# Patient Record
Sex: Female | Born: 1970 | Race: Black or African American | Hispanic: No | Marital: Single | Smoking: Never smoker
Health system: Southern US, Community
[De-identification: ages and names within clinical notes are randomized; demographics above are authoritative.]

## PROBLEM LIST (undated history)

## (undated) HISTORY — PX: HAND SURGERY: SHX662

---

## 1999-02-17 ENCOUNTER — Emergency Department (HOSPITAL_COMMUNITY): Admission: EM | Admit: 1999-02-17 | Discharge: 1999-02-17 | Payer: Self-pay | Admitting: Emergency Medicine

## 1999-11-02 ENCOUNTER — Encounter: Payer: Self-pay | Admitting: Otolaryngology

## 1999-11-03 ENCOUNTER — Encounter (INDEPENDENT_AMBULATORY_CARE_PROVIDER_SITE_OTHER): Payer: Self-pay | Admitting: *Deleted

## 1999-11-03 ENCOUNTER — Inpatient Hospital Stay (HOSPITAL_COMMUNITY): Admission: RE | Admit: 1999-11-03 | Discharge: 1999-11-04 | Payer: Self-pay | Admitting: Otolaryngology

## 1999-11-20 ENCOUNTER — Ambulatory Visit: Admission: RE | Admit: 1999-11-20 | Discharge: 1999-11-20 | Payer: Self-pay | Admitting: Otolaryngology

## 2000-03-09 ENCOUNTER — Encounter: Payer: Self-pay | Admitting: Emergency Medicine

## 2000-03-09 ENCOUNTER — Emergency Department (HOSPITAL_COMMUNITY): Admission: EM | Admit: 2000-03-09 | Discharge: 2000-03-09 | Payer: Self-pay | Admitting: Emergency Medicine

## 2000-03-15 ENCOUNTER — Emergency Department (HOSPITAL_COMMUNITY): Admission: EM | Admit: 2000-03-15 | Discharge: 2000-03-15 | Payer: Self-pay | Admitting: Emergency Medicine

## 2000-05-09 ENCOUNTER — Emergency Department (HOSPITAL_COMMUNITY): Admission: EM | Admit: 2000-05-09 | Discharge: 2000-05-10 | Payer: Self-pay | Admitting: Emergency Medicine

## 2000-06-20 ENCOUNTER — Encounter: Payer: Self-pay | Admitting: Internal Medicine

## 2000-06-20 ENCOUNTER — Ambulatory Visit (HOSPITAL_COMMUNITY): Admission: RE | Admit: 2000-06-20 | Discharge: 2000-06-20 | Payer: Self-pay | Admitting: Internal Medicine

## 2008-07-06 ENCOUNTER — Emergency Department (HOSPITAL_COMMUNITY): Admission: EM | Admit: 2008-07-06 | Discharge: 2008-07-06 | Payer: Self-pay | Admitting: Emergency Medicine

## 2009-03-02 ENCOUNTER — Emergency Department (HOSPITAL_COMMUNITY): Admission: EM | Admit: 2009-03-02 | Discharge: 2009-03-02 | Payer: Self-pay | Admitting: Emergency Medicine

## 2011-01-03 LAB — URINE MICROSCOPIC-ADD ON

## 2011-01-03 LAB — URINALYSIS, ROUTINE W REFLEX MICROSCOPIC
Bilirubin Urine: NEGATIVE
Glucose, UA: NEGATIVE mg/dL
Ketones, ur: NEGATIVE mg/dL
Leukocytes, UA: NEGATIVE
Specific Gravity, Urine: 1.018 (ref 1.005–1.030)
pH: 6 (ref 5.0–8.0)

## 2011-02-11 NOTE — Op Note (Signed)
[place]. [facility]  Patient:    Angela Reynolds, Angela Reynolds                           MRN: [id number] Proc. Date: 11/03/99 Adm. Date:  [id number] Attending:  Barbee Cough                           Operative Report  PREOPERATIVE DIAGNOSIS:  Tonsillar hypertrophy.  Obstructive sleep apnea.  POSTOPERATIVE DIAGNOSIS:  Tonsillar hypertrophy.  Obstructive sleep apnea.  PROCEDURE:  Tonsillectomy.  ANESTHESIA:  General endotracheal anesthesia.  SURGEON:  Kinnie Scales. Annalee Genta, M.D.  COMPLICATIONS:  None.  ESTIMATED BLOOD LOSS:  Minimal.  CONDITION:  The patient was transferred from the operating room to the recovery  room in stable condition.  INDICATIONS:  [name] is a 40 year old black female who was referred from her primary care physician at [facility] for evaluation of significant tonsillar hypertrophy.  The patient has a history of nighttime airway obstruction and recurrent tonsillitis.  Examination revealed 4+ cryptic tonsils with significant erythema.  Given the patients history and examination, I recommended that we undertake tonsillectomy under general anesthesia.  The risks, benefits, and possible complications of this surgical procedure were discussed in detail with [name] and her family.  They understand and concur with our plan for surgery.  DESCRIPTION OF PROCEDURE:  The patient is brought to the operating room on November 03, 1999, and placed in the supine position on the operating table.  General endotracheal anesthesia was established without difficulty and the patient was adequately anesthetized.  A Crowe-Davis mouth gag was inserted and the oral cavity and oropharynx were examined.  The patient was found to have significantly hypertrophied tonsils with chronic cryptic changes.  The surgical procedure was  begun by dissecting the left tonsil in a subcapsular fashion using the Harmonic  scalpel.  The entire left tonsil was resected from  superior pole of the tongue base.  The right tonsil was removed in a similar fashion and all resected tissue was sent to pathology for gross and microscopic evaluation.  Hemostasis was maintained using Bovie suction cautery at the tongue base.  The tonsilar fossae  were gently abraded with a dry tonsilar sponge.  There was no active bleeding. The Crowe-Davis mouth gag was released and reapplied and again there was no evidence of active bleeding.  An oral gastric tube was inserted without difficulty and the stomach contents were aspirated.  The patients oral cavity and oropharynx were then thoroughly irrigated and suctioned.  She was awakened from her anesthetic.  The Crowe-Davis mouth gag was released and removed and again there were no loose or broken teeth and no active bleeding.  She was awakened and extubated without difficulty.  She was then transferred from the operating room to the recovery room in stable condition. DD:  11/03/99 TD:  11/03/99 Job: [id number]

## 2011-02-11 NOTE — Discharge Summary (Signed)
[place]. [facility]  Patient:    Angela Reynolds, Angela Reynolds                           MRN: [id number] Adm. Date:  [id number] Disc. Date: [id number] Attending:  Barbee Cough                           Discharge Summary  SURGICAL PROCEDURE:  Tonsillectomy on November 03, 1999.  ADMISSION DIAGNOSES: 1. Tonsillar hypertrophy. 2. Obstructive sleep apnea. 3. Chronic nasal obstruction.  DISCHARGE DIAGNOSES: 1. Tonsillar hypertrophy. 2. Obstructive sleep apnea. 3. Chronic nasal obstruction.  DISPOSITION AND CONDITION ON DISCHARGE:  The patient is discharged to home in stable condition in the company of her family.  DISCHARGE MEDICATIONS:  Include her preoperative medications, which are: 1. Albuterol metered dose inhaler 2 puffs p.o. p.r.n. 2. Flovent metered dose inhaler 2 puffs b.i.d. 3. Flonase nasal inhaler 2 puffs b.i.d. 4. Allegra 60 mg p.o. q.d. 5. Prozac 10 mg p.o. q.d. 6. Additional discharge medications include:    a. Lortab elixir 2-3 teaspoon every four to six hours p.r.n. pain.    b. Augmentin elixir 400 mg/5 cc 2 teaspoons twice daily for 10 days.  ACTIVITY:  Limited.  No lifting or straining.  DIET:  Liquids and soft diet only.  WOUND CARE:  No specific wound care.  FOLLOW-UP:  The patient will follow up in my office on November 24, 1999, for further postoperative evaluation, or sooner as warranted by worsening symptoms.   HISTORY OF PRESENT ILLNESS:  Angela Reynolds is a 40 year old black female who was referred by her primary care physician at [facility] for evaluation of significant tonsillar hypertrophy and possible airway obstruction.  The patients examination revealed 4+ cryptic tonsils with significant partial obstruction.  In reviewing her symptoms, she has nightly airway obstruction and chronic nasal obstruction. Given the patients history and examination, I recommended that we undertake tonsillectomy.  The risks, benefits, possible  complications of this procedure were discussed in detail with the patient and her family.  They understood and concurred with our plan.  The patient has a significant history of mild reactive airway disease and obstructive sleep apnea and, given these findings, I recommended that we perform the surgery at the main hospital under general anesthesia with postoperative monitoring for possible exacerbation of apnea.  HOSPITAL COURSE:  The patient was admitted to the ENT service under Dr. Clovis Pu care on November 03, 1999.  She was taken to the operating room at [facility]  Main OR, and underwent tonsillectomy under general anesthesia.  She was transferred from the operating room to recovery, and from recovery to [address] for postoperative care.  The patients postoperative course was uneventful.  She was  tolerating a soft oral diet and liquids without difficulty.  She had moderate discomfort which was well controlled with her oral pain medication.  Her cardiac rhythm and pulse oximetry were monitored throughout the night.  On room air, her oxygen saturation was not below 95%.  The patient was discharged on the first postoperative morning.  She was tolerating a normal diet, had normal bowel and bladder function, ambulating without difficulty.  She will follow up in my office in two to three weeks for reevaluation, with the above discharge instructions. She is comfortable with discharge, and will follow up as above.DD:  11/04/99 TD:  11/04/99 Job: [id number]

## 2011-02-18 ENCOUNTER — Emergency Department (HOSPITAL_COMMUNITY)
Admission: EM | Admit: 2011-02-18 | Discharge: 2011-02-18 | Disposition: A | Discharge status: Home / Self Care | Payer: Medicaid Other | Attending: Emergency Medicine | Admitting: Emergency Medicine

## 2011-02-18 DIAGNOSIS — R059 Cough, unspecified: Secondary | ICD-10-CM | POA: Insufficient documentation

## 2011-02-18 DIAGNOSIS — J3489 Other specified disorders of nose and nasal sinuses: Secondary | ICD-10-CM | POA: Insufficient documentation

## 2011-02-18 DIAGNOSIS — R05 Cough: Secondary | ICD-10-CM | POA: Insufficient documentation

## 2011-02-18 DIAGNOSIS — N390 Urinary tract infection, site not specified: Secondary | ICD-10-CM | POA: Insufficient documentation

## 2011-02-18 DIAGNOSIS — N760 Acute vaginitis: Secondary | ICD-10-CM | POA: Insufficient documentation

## 2011-02-18 DIAGNOSIS — J45909 Unspecified asthma, uncomplicated: Secondary | ICD-10-CM | POA: Insufficient documentation

## 2011-02-18 DIAGNOSIS — J029 Acute pharyngitis, unspecified: Secondary | ICD-10-CM | POA: Insufficient documentation

## 2011-02-18 DIAGNOSIS — R109 Unspecified abdominal pain: Secondary | ICD-10-CM | POA: Insufficient documentation

## 2011-02-18 LAB — URINE MICROSCOPIC-ADD ON

## 2011-02-18 LAB — URINALYSIS, ROUTINE W REFLEX MICROSCOPIC
Ketones, ur: NEGATIVE mg/dL
Protein, ur: NEGATIVE mg/dL
Urobilinogen, UA: 1 mg/dL (ref 0.0–1.0)

## 2011-02-18 LAB — WET PREP, GENITAL: Yeast Wet Prep HPF POC: NONE SEEN

## 2011-02-19 LAB — GC/CHLAMYDIA PROBE AMP, GENITAL: GC Probe Amp, Genital: NEGATIVE

## 2011-09-08 ENCOUNTER — Emergency Department (HOSPITAL_COMMUNITY)
Admission: EM | Admit: 2011-09-08 | Discharge: 2011-09-08 | Disposition: A | Discharge status: Home / Self Care | Payer: Medicaid Other | Attending: Emergency Medicine | Admitting: Emergency Medicine

## 2011-09-08 ENCOUNTER — Encounter: Payer: Self-pay | Admitting: *Deleted

## 2011-09-08 DIAGNOSIS — S335XXA Sprain of ligaments of lumbar spine, initial encounter: Secondary | ICD-10-CM | POA: Insufficient documentation

## 2011-09-08 DIAGNOSIS — S161XXA Strain of muscle, fascia and tendon at neck level, initial encounter: Secondary | ICD-10-CM

## 2011-09-08 DIAGNOSIS — S139XXA Sprain of joints and ligaments of unspecified parts of neck, initial encounter: Secondary | ICD-10-CM | POA: Insufficient documentation

## 2011-09-08 DIAGNOSIS — S39012A Strain of muscle, fascia and tendon of lower back, initial encounter: Secondary | ICD-10-CM

## 2011-09-08 DIAGNOSIS — Y92410 Unspecified street and highway as the place of occurrence of the external cause: Secondary | ICD-10-CM | POA: Insufficient documentation

## 2011-09-08 MED ORDER — DIAZEPAM 5 MG PO TABS: 5.0000 mg | ORAL_TABLET | Freq: Two times a day (BID) | ORAL | Status: AC

## 2011-09-08 MED ORDER — HYDROCODONE-ACETAMINOPHEN 5-500 MG PO TABS: 1.0000 | ORAL_TABLET | Freq: Four times a day (QID) | ORAL | Status: AC | PRN

## 2011-09-08 MED ORDER — DIAZEPAM 5 MG PO TABS
5.0000 mg | ORAL_TABLET | Freq: Once | ORAL | Status: AC
  Administered 2011-09-08: 5 mg via ORAL
  Filled 2011-09-08: qty 1

## 2011-09-08 MED ORDER — HYDROCODONE-ACETAMINOPHEN 5-325 MG PO TABS
1.0000 | ORAL_TABLET | Freq: Once | ORAL | Status: AC
  Administered 2011-09-08: 1 via ORAL
  Filled 2011-09-08: qty 1
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
## 2011-09-12 NOTE — ED Provider Notes (Signed)
Medical screening examination/treatment/procedure(s) were performed by non-physician practitioner and as supervising physician I was immediately available for consultation/collaboration.   Luie Laneve, MD 09/12/11 0809 

## 2013-08-20 ENCOUNTER — Encounter (HOSPITAL_COMMUNITY): Payer: Self-pay | Admitting: Emergency Medicine

## 2013-08-20 DIAGNOSIS — J45901 Unspecified asthma with (acute) exacerbation: Secondary | ICD-10-CM | POA: Insufficient documentation

## 2013-08-20 DIAGNOSIS — R0982 Postnasal drip: Secondary | ICD-10-CM | POA: Insufficient documentation

## 2013-08-20 DIAGNOSIS — Z79899 Other long term (current) drug therapy: Secondary | ICD-10-CM | POA: Insufficient documentation

## 2013-08-20 NOTE — ED Notes (Signed)
Pt states that for the past 3 weeks she has been having a productive cough. Pt states that she has been using her nebulizers and that helps with the productivity of the cough.

## 2013-08-21 ENCOUNTER — Emergency Department (HOSPITAL_COMMUNITY): Payer: Medicaid Other

## 2013-08-21 ENCOUNTER — Emergency Department (HOSPITAL_COMMUNITY)
Admission: EM | Admit: 2013-08-21 | Discharge: 2013-08-21 | Disposition: A | Discharge status: Home / Self Care | Payer: Medicaid Other | Attending: Emergency Medicine | Admitting: Emergency Medicine

## 2013-08-21 DIAGNOSIS — R0982 Postnasal drip: Secondary | ICD-10-CM

## 2013-08-21 MED ORDER — LORATADINE 10 MG PO TABS
10.0000 mg | ORAL_TABLET | Freq: Once | ORAL | Status: AC
  Administered 2013-08-21: 10 mg via ORAL
  Filled 2013-08-21: qty 1

## 2013-08-21 MED ORDER — IBUPROFEN 800 MG PO TABS
800.0000 mg | ORAL_TABLET | Freq: Once | ORAL | Status: AC
  Administered 2013-08-21: 800 mg via ORAL
  Filled 2013-08-21: qty 1

## 2013-08-21 MED ORDER — FLUTICASONE PROPIONATE 50 MCG/ACT NA SUSP: 1.0000 | Freq: Every day | NASAL | Status: DC

## 2013-08-21 MED ORDER — LORATADINE 10 MG PO TABS: 10.0000 mg | ORAL_TABLET | Freq: Every day | ORAL | Status: DC

## 2013-08-21 MED ORDER — GUAIFENESIN ER 600 MG PO TB12: 600.0000 mg | ORAL_TABLET | Freq: Two times a day (BID) | ORAL | Status: DC

## 2013-08-21 NOTE — ED Provider Notes (Signed)
CSN: [id number]     Arrival date & time 08/20/13  2328 History   First MD Initiated Contact with Patient 08/21/13 0109     Chief Complaint  Patient presents with  . Cough   (Consider location/radiation/quality/duration/timing/severity/associated sxs/prior Treatment) Patient is a 42 y.o. female presenting with cough. The history is provided by the patient.  Cough Cough characteristics:  Productive Sputum characteristics:  Clear Severity:  Mild Onset quality:  Gradual Timing:  Intermittent Progression:  Unchanged Chronicity:  New Smoker: no   Context: upper respiratory infection and weather changes   Relieved by:  Nothing Worsened by:  Nothing tried Ineffective treatments:  Beta-agonist inhaler Associated symptoms: wheezing   Associated symptoms: no chest pain, no fever and no shortness of breath   No travel no leg swelling.    Past Medical History  Diagnosis Date  . Asthma    Past Surgical History  Procedure Laterality Date  . Hand surgery     History reviewed. No pertinent family history. History  Substance Use Topics  . Smoking status: Never Smoker   . Smokeless tobacco: Not on file  . Alcohol Use: Yes     Comment: occassion   OB History   Grav Para Term Preterm Abortions TAB SAB Ect Mult Living                 Review of Systems  Constitutional: Negative for fever.  HENT: Positive for congestion.   Respiratory: Positive for cough and wheezing. Negative for chest tightness and shortness of breath.   Cardiovascular: Negative for chest pain, palpitations and leg swelling.  All other systems reviewed and are negative.    Allergies  Amoxicillin  Home Medications   Current Outpatient Rx  Name  Route  Sig  Dispense  Refill  . albuterol (PROVENTIL HFA;VENTOLIN HFA) 108 (90 BASE) MCG/ACT inhaler   Inhalation   Inhale 2 puffs into the lungs every 6 (six) hours as needed.           Marland Kitchen albuterol (PROVENTIL) (2.5 MG/3ML) 0.083% nebulizer solution  Nebulization   Take 2.5 mg by nebulization every 4 (four) hours as needed for wheezing or shortness of breath.         . budesonide-formoterol (SYMBICORT) 160-4.5 MCG/ACT inhaler   Inhalation   Inhale 2 puffs into the lungs 2 (two) times daily.            BP 105/54  Pulse 89  Temp(Src) 98.9 F (37.2 C) (Oral)  Resp 18  Wt 332 lb 6.4 oz (150.776 kg)  SpO2 96%  LMP 08/03/2013 Physical Exam  Constitutional: She appears well-developed and well-nourished. No distress.  HENT:  Head: Atraumatic.  Mouth/Throat: No oropharyngeal exudate.  Cobblestoning and drainage consistent with post nasal drip  Eyes: Conjunctivae are normal. Pupils are equal, round, and reactive to light.  Neck: Normal range of motion. Neck supple.  Cardiovascular: Normal rate, regular rhythm and intact distal pulses.   Pulmonary/Chest: Effort normal and breath sounds normal. No stridor. No respiratory distress. She has no wheezes. She has no rales.  Abdominal: Soft. Bowel sounds are normal. There is no tenderness. There is no rebound and no guarding.  Musculoskeletal: Normal range of motion. She exhibits no edema and no tenderness.  Skin: Skin is warm and dry.  Psychiatric: She has a normal mood and affect.    ED Course  Procedures (including critical care time) Labs Review Labs Reviewed - No data to display Imaging Review Dg Chest Portable 1 View  08/21/2013   CLINICAL DATA:  Cough.  Asthma.  EXAM: PORTABLE CHEST - 1 VIEW  COMPARISON:  03/02/2009  FINDINGS: The heart size and mediastinal contours are within normal limits. Both lungs are clear. The visualized skeletal structures are unremarkable.  IMPRESSION: No active disease.   Electronically Signed   By: Myles Rosenthal M.D.   On: 08/21/2013 00:50    EKG Interpretation   None       MDM  No diagnosis found. Exam and symptoms consistent with post nasal drip will treat    Andron Marrazzo K Berlie Persky-Rasch, MD 08/21/13 [phone]

## 2013-08-21 NOTE — ED Notes (Signed)
Pt. Reports cough started x3 weeks ago with cold. Pt. States intermittently productive with white sputum. States has been using nebulizer treatment 4-5 times a day with no relief.

## 2014-06-30 ENCOUNTER — Emergency Department (HOSPITAL_COMMUNITY)
Admission: EM | Admit: 2014-06-30 | Discharge: 2014-06-30 | Disposition: A | Discharge status: Home / Self Care | Payer: Medicaid Other | Attending: Emergency Medicine | Admitting: Emergency Medicine

## 2014-06-30 ENCOUNTER — Emergency Department (HOSPITAL_COMMUNITY): Payer: Medicaid Other

## 2014-06-30 ENCOUNTER — Encounter (HOSPITAL_COMMUNITY): Payer: Self-pay | Admitting: Emergency Medicine

## 2014-06-30 DIAGNOSIS — Z880 Allergy status to penicillin: Secondary | ICD-10-CM | POA: Diagnosis not present

## 2014-06-30 DIAGNOSIS — J45909 Unspecified asthma, uncomplicated: Secondary | ICD-10-CM

## 2014-06-30 DIAGNOSIS — Y939 Activity, unspecified: Secondary | ICD-10-CM | POA: Insufficient documentation

## 2014-06-30 DIAGNOSIS — S60222D Contusion of left hand, subsequent encounter: Secondary | ICD-10-CM | POA: Diagnosis not present

## 2014-06-30 DIAGNOSIS — E669 Obesity, unspecified: Secondary | ICD-10-CM | POA: Insufficient documentation

## 2014-06-30 DIAGNOSIS — Y929 Unspecified place or not applicable: Secondary | ICD-10-CM | POA: Diagnosis not present

## 2014-06-30 DIAGNOSIS — Z79899 Other long term (current) drug therapy: Secondary | ICD-10-CM | POA: Diagnosis not present

## 2014-06-30 DIAGNOSIS — J45901 Unspecified asthma with (acute) exacerbation: Secondary | ICD-10-CM | POA: Diagnosis not present

## 2014-06-30 DIAGNOSIS — S59901D Unspecified injury of right elbow, subsequent encounter: Secondary | ICD-10-CM | POA: Insufficient documentation

## 2014-06-30 DIAGNOSIS — Z7951 Long term (current) use of inhaled steroids: Secondary | ICD-10-CM | POA: Insufficient documentation

## 2014-06-30 DIAGNOSIS — Z9889 Other specified postprocedural states: Secondary | ICD-10-CM | POA: Insufficient documentation

## 2014-06-30 DIAGNOSIS — S60222A Contusion of left hand, initial encounter: Secondary | ICD-10-CM

## 2014-06-30 DIAGNOSIS — W1830XD Fall on same level, unspecified, subsequent encounter: Secondary | ICD-10-CM | POA: Insufficient documentation

## 2014-06-30 LAB — I-STAT TROPONIN, ED: Troponin i, poc: 0 ng/mL (ref 0.00–0.08)

## 2014-06-30 LAB — CBC WITH DIFFERENTIAL/PLATELET
BASOS PCT: 0 % (ref 0–1)
Basophils Absolute: 0 10*3/uL (ref 0.0–0.1)
EOS PCT: 3 % (ref 0–5)
Eosinophils Absolute: 0.3 10*3/uL (ref 0.0–0.7)
HCT: 36.6 % (ref 36.0–46.0)
HEMOGLOBIN: 12.4 g/dL (ref 12.0–15.0)
LYMPHS PCT: 40 % (ref 12–46)
Lymphs Abs: 3.9 10*3/uL (ref 0.7–4.0)
MCH: 30.7 pg (ref 26.0–34.0)
MCHC: 33.9 g/dL (ref 30.0–36.0)
MCV: 90.6 fL (ref 78.0–100.0)
Monocytes Absolute: 0.9 10*3/uL (ref 0.1–1.0)
Monocytes Relative: 9 % (ref 3–12)
NEUTROS PCT: 48 % (ref 43–77)
Neutro Abs: 4.6 10*3/uL (ref 1.7–7.7)
Platelets: 280 10*3/uL (ref 150–400)
RBC: 4.04 MIL/uL (ref 3.87–5.11)
RDW: 14.2 % (ref 11.5–15.5)
WBC: 9.7 10*3/uL (ref 4.0–10.5)

## 2014-06-30 LAB — BASIC METABOLIC PANEL
Anion gap: 8 (ref 5–15)
BUN: 14 mg/dL (ref 6–23)
CO2: 27 meq/L (ref 19–32)
Calcium: 8.8 mg/dL (ref 8.4–10.5)
Chloride: 100 mEq/L (ref 96–112)
Creatinine, Ser: 0.96 mg/dL (ref 0.50–1.10)
GFR calc Af Amer: 82 mL/min — ABNORMAL LOW (ref >90)
GFR, EST NON AFRICAN AMERICAN: 70 mL/min — AB (ref >90)
GLUCOSE: 96 mg/dL (ref 70–99)
POTASSIUM: 4.3 meq/L (ref 3.7–5.3)
SODIUM: 135 meq/L — AB (ref 137–147)

## 2014-06-30 LAB — PRO B NATRIURETIC PEPTIDE: PRO B NATRI PEPTIDE: 14.4 pg/mL (ref 0–125)

## 2014-06-30 MED ORDER — BUDESONIDE-FORMOTEROL FUMARATE 160-4.5 MCG/ACT IN AERO: 2.0000 | INHALATION_SPRAY | Freq: Two times a day (BID) | RESPIRATORY_TRACT | Status: DC

## 2014-06-30 MED ORDER — ALBUTEROL SULFATE HFA 108 (90 BASE) MCG/ACT IN AERS: 2.0000 | INHALATION_SPRAY | RESPIRATORY_TRACT | Status: DC | PRN

## 2014-06-30 MED ORDER — ALBUTEROL SULFATE HFA 108 (90 BASE) MCG/ACT IN AERS
2.0000 | INHALATION_SPRAY | Freq: Once | RESPIRATORY_TRACT | Status: AC
  Administered 2014-06-30: 2 via RESPIRATORY_TRACT
  Filled 2014-06-30: qty 6.7

## 2014-06-30 NOTE — ED Notes (Signed)
Pt walked from waiting area to room without distress. No SOB noted.
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
## 2014-06-30 NOTE — ED Notes (Signed)
Patient transported to X-ray without distress.  

## 2014-06-30 NOTE — Discharge Instructions (Signed)
Take acetaminophen (Tylenol) up to 975 mg (this is normally 3 over-the-counter pills) up to 3 times a day. Do not drink alcohol. Make sure your other medications do not contain acetaminophen (Read the labels!)  Do not hesitate to return to the emergency room for any new, worsening or concerning symptoms.  Please obtain primary care using resource guide below. But the minute you were seen in the emergency room and that they will need to obtain records for further outpatient management.   Asthma Asthma is a condition of the lungs in which the airways tighten and narrow. Asthma can make it hard to breathe. Asthma cannot be cured, but medicine and lifestyle changes can help control it. Asthma may be started (triggered) by:  Animal skin flakes (dander).  Dust.  Cockroaches.  Pollen.  Mold.  Smoke.  Cleaning products.  Hair sprays or aerosol sprays.  Paint fumes or strong smells.  Cold air, weather changes, and winds.  Crying or laughing hard.  Stress.  Certain medicines or drugs.  Foods, such as dried fruit, potato chips, and sparkling grape juice.  Infections or conditions (colds, flu).  Exercise.  Certain medical conditions or diseases.  Exercise or tiring activities. HOME CARE   Take medicine as told by your doctor.  Use a peak flow meter as told by your doctor. A peak flow meter is a tool that measures how well the lungs are working.  Record and keep track of the peak flow meter's readings.  Understand and use the asthma action plan. An asthma action plan is a written plan for taking care of your asthma and treating your attacks.  To help prevent asthma attacks:  Do not smoke. Stay away from secondhand smoke.  Change your heating and air conditioning filter often.  Limit your use of fireplaces and wood stoves.  Get rid of pests (such as roaches and mice) and their droppings.  Throw away plants if you see mold on them.  Clean your floors. Dust  regularly. Use cleaning products that do not smell.  Have someone vacuum when you are not home. Use a vacuum cleaner with a HEPA filter if possible.  Replace carpet with wood, tile, or vinyl flooring. Carpet can trap animal skin flakes and dust.  Use allergy-proof pillows, mattress covers, and box spring covers.  Wash bed sheets and blankets every week in hot water and dry them in a dryer.  Use blankets that are made of polyester or cotton.  Clean bathrooms and kitchens with bleach. If possible, have someone repaint the walls in these rooms with mold-resistant paint. Keep out of the rooms that are being cleaned and painted.  Wash hands often. GET HELP IF:  You have make a whistling sound when breaking (wheeze), have shortness of breath, or have a cough even if taking medicine to prevent attacks.  The colored mucus you cough up (sputum) is thicker than usual.  The colored mucus you cough up changes from clear or white to yellow, green, gray, or bloody.  You have problems from the medicine you are taking such as:  A rash.  Itching.  Swelling.  Trouble breathing.  You need reliever medicines more than 2-3 times a week.  Your peak flow measurement is still at 50-79% of your personal best after following the action plan for 1 hour.  You have a fever. GET HELP RIGHT AWAY IF:   You seem to be worse and are not responding to medicine during an asthma attack.  You are  short of breath even at rest.  You get short of breath when doing very little activity.  You have trouble eating, drinking, or talking.  You have chest pain.  You have a fast heartbeat.  Your lips or fingernails start to turn blue.  You are light-headed, dizzy, or faint.  Your peak flow is less than 50% of your personal best. MAKE SURE YOU:   Understand these instructions.  Will watch your condition.  Will get help right away if you are not doing well or get worse. Document Released: 02/29/2008  Document Revised: 01/27/2014 Document Reviewed: 04/11/2013 [facility] Patient Information 2015 [place], [place]. This information is not intended to replace advice given to you by your health care provider. Make sure you discuss any questions you have with your health care provider.    Emergency Department Resource Guide 1) Find a Doctor and Pay Out of Pocket Although you won't have to find out who is covered by your insurance plan, it is a good idea to ask around and get recommendations. You will then need to call the office and see if the doctor you have chosen will accept you as a new patient and what types of options they offer for patients who are self-pay. Some doctors offer discounts or will set up payment plans for their patients who do not have insurance, but you will need to ask so you aren't surprised when you get to your appointment.  2) Contact Your Local Health Department Not all health departments have doctors that can see patients for sick visits, but many do, so it is worth a call to see if yours does. If you don't know where your local health department is, you can check in your phone book. The CDC also has a tool to help you locate your state's health department, and many state websites also have listings of all of their local health departments.  3) Find a Walk-in Clinic If your illness is not likely to be very severe or complicated, you may want to try a walk in clinic. These are popping up all over the country in pharmacies, drugstores, and shopping centers. They're usually staffed by nurse practitioners or physician assistants that have been trained to treat common illnesses and complaints. They're usually fairly quick and inexpensive. However, if you have serious medical issues or chronic medical problems, these are probably not your best option.  No Primary Care Doctor: - Call Health Connect at  [phone] - they can help you locate a primary care doctor that  accepts your  insurance, provides certain services, etc. - Physician Referral Service- [phone]  Chronic Pain Problems: Organization         Address  Phone   Notes  [facility]  [phone] Patients need to be referred by their primary care doctor.   Medication Assistance: Organization         Address  Phone   Notes  [facility] [address] [place] [place], [place] [address] [phone] --Must be a resident of [facility] -- Must have NO insurance coverage whatsoever (no Medicaid/ Medicare, etc.) -- The pt. MUST have a primary care doctor that directs their care regularly and follows them in the community   MedAssist  [phone]   [employer]  [phone]    Agencies that provide inexpensive medical care: Organization         Address  Phone   Notes  [facility]  [phone]   [facility]    [phone]   [facility] [address] [place], [place] [address] [phone]   [facility] [address], [place] [phone]   Planned Parenthood    [phone]   [facility]    [phone]   Community Health and [facility]  [address], [place] Phone:  [phone], Fax:  [phone] Hours of Operation:  9 am - 6 pm, M-F.  Also accepts Medicaid/Medicare and self-pay.  [facility]  [address], [place] Phone: [phone], Fax: [phone]. Hours of Operation:  8:30 am - 5:30 pm, M-F.  Also accepts Medicaid and self-pay.  [facility] [place] [address], [place] Phone: [phone]   [facility] [address] Natasha Bence [place], [place] [phone], Ext. [phone] Mondays & Thursdays: 7-9 AM.  First 15 patients are seen on a first come, first serve basis.    Medicaid-accepting [facility] Providers:  Organization          Address  Phone   Notes  [facility] [address], [place] [phone] Also accepts self-pay patients.  [facility] [address] Laurell Josephs [place], [place]  [phone]   [facility] [address], [place] [phone]   [facility] [address] [place] [phone]   Renaye Rakers [address], [place]   [phone] Only accepts [place] patients after they have their name applied to their card.   Self-Pay (no insurance) in [facility]:  Organization         Address  Phone   Notes  Sickle Cell Patients, [facility] [address] [place], [place] [phone]   [facility] [address] [place], [place] [phone]   [facility] [place]  [address], [place] [phone]   [facility]/Dr. Osei-Bonsu  [address], [place] or [address], [place] [phone] Phone number for both [place] and [place] locations is the same.  [facility]gent [facility] and [facility] [address] [place] [phone]   [facility] [address], [place] or [address] [phone]   [facility] [address] [place] [phone], phone; [phone], fax Sees patients 1st and 3rd Saturday of every month.  Must not qualify for public or private insurance (i.e. Medicaid, Medicare, [place] Health Choice, Veterans' Benefits)  Household income should be no more than 200% of the poverty level The clinic cannot treat you if you are pregnant or think you are pregnant  Sexually transmitted diseases are not treated at the clinic.    Dental Care: Organization         Address  Phone  Notes  [facility] Department of [facility] [address] [place],  [place] [phone] Accepts children up to age 45 who are enrolled in [place] or [place] Health Choice; pregnant women with a Medicaid card; and children who have applied for Medicaid or [place] Health Choice, but were declined, whose parents can pay a reduced fee at time of service.  [facility] Department of [employer]  [place]  [address], [place] [phone] Accepts children up to age 69 who are enrolled in Medicaid or [place] Health Choice; pregnant women with a Medicaid card; and children who have applied for Medicaid or [place] Health Choice, but were declined, whose parents can pay a reduced fee at time of service.  [facility]  [address] [place], [place] [phone] Patients are seen by appointment only. Walk-ins are not accepted. [facility] will see patients 97 years of age and older. Monday - Tuesday (8am-5pm) Most Wednesdays (8:30-5pm) $30 per visit, cash only  [facility] Adult Dental Access PROGRAM  [address], [facility] [phone] Patients are seen by appointment only. Walk-ins are not accepted. [facility] will see patients 49 years of age and older. One Wednesday Evening (Monthly: Volunteer Based).  $30 per visit, cash only  [employer]  [phone] for adults; Children under age 57, call Graduate Pediatric Dentistry at [phone]. Children aged 33-14, please call [phone] to request a pediatric application.  Dental services are provided in all areas of dental care including fillings, crowns and bridges, complete and partial dentures, implants, gum treatment, root canals, and extractions. Preventive care is also provided. Treatment is provided to both adults and children. Patients are selected via a lottery and there is often a waiting list.   [facility] [address], [place]  [phone] [email or web address]   [facility] [address] [place], [place]  [phone], Ext. [phone] Second and Fourth Thursday of each month, opens at 6:30 AM; Clinic ends at 9 AM.  Patients are seen on a first-come first-served basis, and a limited number are seen during each clinic.   [facility]  [address] Ether Griffins [place], [place] [phone]   Eligibility Requirements You must have lived in [place], [place], or [place] counties for at least the last three months.   You cannot be eligible for state or federal sponsored [place], including [employer], [place], or [employer].   You generally cannot be eligible for healthcare insurance through your employer.    How to apply: Eligibility screenings are held every Tuesday and Wednesday afternoon from 1:00 pm until 4:00 pm. You do not need an appointment for the interview!  [facility] [address], [place], [place] [phone]   [facility] Health Department  [phone]   [facility] Health Department  [phone]   [facility] Health Department  [phone]    Behavioral Health Resources in the Community: Intensive Outpatient Programs Organization         Address  Phone  Notes  [facility] [address] [place], [place] [phone]   [facility] Outpatient [address], [place], [place] [phone]   [facility] [address] [place], [place]  [phone]   [facility] [address],  [place], [place] [phone] or [phone]   Substance Abuse Resources Organization         Address  Phone  Notes  [facility] Drug Services  [phone]   [facility]  [phone]   [facility] [place]  [phone]   Floydene Flock  [phone]   Residential & Outpatient Substance Abuse Program  [phone]   Psychological Services Organization         Address  Phone  Notes  [facility]  [phone]  [employer]  [phone]- [phone]    [facility] [address], [place] [phone] or [phone]    Mobile Crisis Teams Organization         Address  Phone  Notes  [facility], Mobile Crisis Care Unit  [phone]   [facility]  [address] [place], [place] [phone]   Doristine LocksSharon DeEsch [address] [place] [phone]    Self-Help/Support Groups Organization         Address  Phone             Notes  [facility] [place] - variety of support groups  336- [phone] Call for more information  Narcotics Anonymous (NA), [facility] [address], [place]  2 meetings at this location   Statisticianesidential Treatment Programs Organization         Address  Phone  Notes  [facility] [address],    [place]  [phone]   [facility]  [address], [place] [phone], [place], [place] [phone]   [facility] [address], [place] [phone] Admissions: 8am-3pm M-F  [facility] [address]    [place], [place] [phone]   [facility] [address], [place], [place] [phone]   [facility] [address]  [place], [place] [phone]   [facility] - Intensive Outpatient [address], [place], [place] [phone]   [facility] ([facility] [address]  [place], [place] [phone] or [phone]   [facility] ([facility]) [address] [place], [place] [phone] Accepts Medicaid  [facility] [address]  [place] [phone] Substance Abuse/Addiction Treatment   [facility] Resources Organization         Address  Phone  Notes  [facility]  [phone]) [phone]   Angie FavaJulie Brannon, PhD [address] [place], [place]   [phone]) [phone] or [phone](336) [phone]   [facility]    [address] [place], [place] [phone]   [facility] [address], [place], [place] [phone]) [phone] Insurance/Medicaid/sponsorship through [facility]  [facility] [address]                                    [place], [place] [phone]) [phone] Therapy/tele-psych/case  [facility] [address]   [place], [place] [phone]    Dr. Lolly MustacheArfeen  [phone]   [facility]  [employer] [facility] 1) [address], [place] 2) [address], [place] 3)  [address], [place] [phone]  [phone]) [phone]   [facility] [place] [facility] Child Abuse Hotline [phone](336) [phone] or [phone](336) [phone] (After Hours)

## 2014-06-30 NOTE — ED Notes (Signed)
Pt in peds with kid

## 2014-06-30 NOTE — ED Notes (Signed)
Pt has multiple complaints. Pt c/o asthma symptoms of chest tightness and shortness of breath. Pt states she is out of her medication and can't wait to sed her doctor. Pt also c/o left hand and left elbow pain from a fall two weeks ago. Pt c/o weakness and states she had a syncopal episode while sitting at a stop light last week. Pt denies getting a medical evaluation for her syncopal episode. Pt states that is a normal response to her asthma "acting up". Pt is not clear on what exactly she would like to be seen for and is a poor historian regarding the time frame with her symptoms. Pt ambulatory to triage and in no apparent distress.

## 2014-06-30 NOTE — ED Notes (Signed)
PA at bedside.

## 2014-06-30 NOTE — ED Notes (Signed)
PT ambulated with baseline gait; VSS; A&Ox3; no signs of distress; respirations even and unlabored; skin warm and dry; no questions upon discharge.  

## 2014-07-01 NOTE — ED Provider Notes (Signed)
Medical screening examination/treatment/procedure(s) were performed by non-physician practitioner and as supervising physician I was immediately available for consultation/collaboration.   EKG Interpretation None       Angela BakerAnthony T Shawnie Nicole, MD 07/01/14 1219

## 2014-12-18 ENCOUNTER — Emergency Department (HOSPITAL_COMMUNITY): Payer: Medicaid Other

## 2014-12-18 ENCOUNTER — Emergency Department (HOSPITAL_COMMUNITY)
Admission: EM | Admit: 2014-12-18 | Discharge: 2014-12-18 | Disposition: A | Discharge status: Home / Self Care | Payer: Medicaid Other | Attending: Emergency Medicine | Admitting: Emergency Medicine

## 2014-12-18 ENCOUNTER — Encounter (HOSPITAL_COMMUNITY): Payer: Self-pay | Admitting: *Deleted

## 2014-12-18 DIAGNOSIS — M25551 Pain in right hip: Secondary | ICD-10-CM | POA: Insufficient documentation

## 2014-12-18 DIAGNOSIS — M25559 Pain in unspecified hip: Secondary | ICD-10-CM

## 2014-12-18 DIAGNOSIS — R079 Chest pain, unspecified: Secondary | ICD-10-CM | POA: Diagnosis present

## 2014-12-18 DIAGNOSIS — J45909 Unspecified asthma, uncomplicated: Secondary | ICD-10-CM | POA: Diagnosis not present

## 2014-12-18 DIAGNOSIS — R0789 Other chest pain: Secondary | ICD-10-CM | POA: Insufficient documentation

## 2014-12-18 DIAGNOSIS — Z880 Allergy status to penicillin: Secondary | ICD-10-CM | POA: Insufficient documentation

## 2014-12-18 DIAGNOSIS — Z79899 Other long term (current) drug therapy: Secondary | ICD-10-CM | POA: Insufficient documentation

## 2014-12-18 DIAGNOSIS — Z7952 Long term (current) use of systemic steroids: Secondary | ICD-10-CM | POA: Diagnosis not present

## 2014-12-18 LAB — COMPREHENSIVE METABOLIC PANEL
ALT: 17 U/L (ref 0–35)
AST: 14 U/L (ref 0–37)
Albumin: 3.2 g/dL — ABNORMAL LOW (ref 3.5–5.2)
Alkaline Phosphatase: 36 U/L — ABNORMAL LOW (ref 39–117)
Anion gap: 9 (ref 5–15)
BILIRUBIN TOTAL: 0.7 mg/dL (ref 0.3–1.2)
BUN: 8 mg/dL (ref 6–23)
CO2: 25 mmol/L (ref 19–32)
CREATININE: 0.82 mg/dL (ref 0.50–1.10)
Calcium: 9.2 mg/dL (ref 8.4–10.5)
Chloride: 102 mmol/L (ref 96–112)
GFR calc Af Amer: >90 mL/min (ref >90)
GFR, EST NON AFRICAN AMERICAN: 85 mL/min — AB (ref >90)
Glucose, Bld: 96 mg/dL (ref 70–99)
Potassium: 4.2 mmol/L (ref 3.5–5.1)
Sodium: 136 mmol/L (ref 135–145)
Total Protein: 6.6 g/dL (ref 6.0–8.3)

## 2014-12-18 LAB — CBC WITH DIFFERENTIAL/PLATELET
Basophils Absolute: 0 10*3/uL (ref 0.0–0.1)
Basophils Relative: 0 % (ref 0–1)
Eosinophils Absolute: 0.2 10*3/uL (ref 0.0–0.7)
Eosinophils Relative: 2 % (ref 0–5)
HCT: 37.2 % (ref 36.0–46.0)
Hemoglobin: 12.6 g/dL (ref 12.0–15.0)
Lymphocytes Relative: 30 % (ref 12–46)
Lymphs Abs: 2.8 10*3/uL (ref 0.7–4.0)
MCH: 31 pg (ref 26.0–34.0)
MCHC: 33.9 g/dL (ref 30.0–36.0)
MCV: 91.4 fL (ref 78.0–100.0)
MONOS PCT: 10 % (ref 3–12)
Monocytes Absolute: 1 10*3/uL (ref 0.1–1.0)
Neutro Abs: 5.3 10*3/uL (ref 1.7–7.7)
Neutrophils Relative %: 58 % (ref 43–77)
Platelets: 263 10*3/uL (ref 150–400)
RBC: 4.07 MIL/uL (ref 3.87–5.11)
RDW: 13.9 % (ref 11.5–15.5)
WBC: 9.3 10*3/uL (ref 4.0–10.5)

## 2014-12-18 LAB — I-STAT TROPONIN, ED
Troponin i, poc: 0 ng/mL (ref 0.00–0.08)
Troponin i, poc: 0 ng/mL (ref 0.00–0.08)

## 2014-12-18 LAB — LIPASE, BLOOD: Lipase: 25 U/L (ref 11–59)

## 2014-12-18 LAB — D-DIMER, QUANTITATIVE (NOT AT ARMC): D-Dimer, Quant: 0.56 ug/mL-FEU — ABNORMAL HIGH (ref 0.00–0.48)

## 2014-12-18 MED ORDER — ALBUTEROL SULFATE HFA 108 (90 BASE) MCG/ACT IN AERS: 2.0000 | INHALATION_SPRAY | RESPIRATORY_TRACT | Status: DC | PRN

## 2014-12-18 MED ORDER — MORPHINE SULFATE 4 MG/ML IJ SOLN: 4.0000 mg | Freq: Once | INTRAMUSCULAR | Status: DC

## 2014-12-18 MED ORDER — FLUTICASONE PROPIONATE 50 MCG/ACT NA SUSP: 1.0000 | Freq: Every day | NASAL | Status: DC | PRN

## 2014-12-18 MED ORDER — GI COCKTAIL ~~LOC~~
30.0000 mL | Freq: Once | ORAL | Status: AC
  Administered 2014-12-18: 30 mL via ORAL
  Filled 2014-12-18: qty 30

## 2014-12-18 MED ORDER — IOHEXOL 350 MG/ML SOLN
100.0000 mL | Freq: Once | INTRAVENOUS | Status: AC | PRN
  Administered 2014-12-18: 100 mL via INTRAVENOUS

## 2014-12-18 MED ORDER — BUDESONIDE-FORMOTEROL FUMARATE 160-4.5 MCG/ACT IN AERO: 2.0000 | INHALATION_SPRAY | Freq: Two times a day (BID) | RESPIRATORY_TRACT | Status: AC

## 2014-12-18 MED ORDER — ONDANSETRON HCL 4 MG/2ML IJ SOLN
4.0000 mg | Freq: Once | INTRAMUSCULAR | Status: AC
  Administered 2014-12-18: 4 mg via INTRAVENOUS
  Filled 2014-12-18: qty 2

## 2014-12-18 MED ORDER — HYDROCODONE-ACETAMINOPHEN 5-325 MG PO TABS: 1.0000 | ORAL_TABLET | ORAL | Status: DC | PRN

## 2014-12-18 MED ORDER — ONDANSETRON HCL 4 MG/2ML IJ SOLN
4.0000 mg | Freq: Once | INTRAMUSCULAR | Status: DC
  Filled 2014-12-18: qty 2

## 2014-12-18 MED ORDER — CYCLOBENZAPRINE HCL 5 MG PO TABS: 10.0000 mg | ORAL_TABLET | Freq: Two times a day (BID) | ORAL | Status: DC | PRN

## 2014-12-18 MED ORDER — SODIUM CHLORIDE 0.9 % IV SOLN
INTRAVENOUS | Status: DC
  Administered 2014-12-18: 12:00:00 via INTRAVENOUS

## 2014-12-18 MED ORDER — MORPHINE SULFATE 4 MG/ML IJ SOLN
4.0000 mg | Freq: Once | INTRAMUSCULAR | Status: AC
  Administered 2014-12-18: 4 mg via INTRAVENOUS
  Filled 2014-12-18: qty 1

## 2014-12-18 MED ORDER — ALBUTEROL SULFATE (2.5 MG/3ML) 0.083% IN NEBU: 2.5000 mg | INHALATION_SOLUTION | RESPIRATORY_TRACT | Status: DC | PRN

## 2014-12-18 MED ORDER — NAPROXEN 375 MG PO TABS: 375.0000 mg | ORAL_TABLET | Freq: Two times a day (BID) | ORAL | Status: DC

## 2014-12-18 MED ORDER — MORPHINE SULFATE 4 MG/ML IJ SOLN
4.0000 mg | Freq: Once | INTRAMUSCULAR | Status: AC
Start: 2014-12-18 — End: 2014-12-18
  Administered 2014-12-18: 4 mg via INTRAVENOUS
  Filled 2014-12-18: qty 1

## 2014-12-18 NOTE — ED Notes (Signed)
Pt brought back to room via wheelchair; pt undressed, in gown, on monitor, continuous pulse oximetry and blood pressure cuff 

## 2014-12-18 NOTE — ED Notes (Signed)
Pt states that she has a sharp left sided chest pain. Pt states that pain is worse with movement. Denies radiation or associated symptoms.

## 2014-12-18 NOTE — ED Provider Notes (Signed)
CSN: [id number]     Arrival date & time 12/18/14  1039 History   First MD Initiated Contact with Patient 12/18/14 1042     Chief Complaint  Patient presents with  . Chest Pain     (Consider location/radiation/quality/duration/timing/severity/associated sxs/prior Treatment) HPI   Angela Reynolds is a 44 year old female presenting to the ED with a chief complaint of chest pain. Pt states that the chest pain started at 9:50 this morning and has been constant since onset. The pain is in the left side of her chest and goes straight through to her back under her shoulder blade. She describes the pain as "stabbing" and pressure and states it is a 9/10 pain. She has has nasal congestion. The pain is worsened with breathing or any movements, especially coughing. She took a Aspirin 325 mg, waited about 30 minutes, and decided to come in because the pain was not going away. She has an  Year old daughter who she describes frequently jumps on her.  The patient has experienced chest pain once before about 2 months ago that was relieved by taking aspirin. Patient denies difficulty breathing. Has h/o asthma. Denies lower extremity swelling. Denies abdominal pain, vomiting and diarrhea. States she has been having episodes of nausea for about  weeks. Non-smoker, drinks occasionally.    Past Medical History  Diagnosis Date  . Asthma    Past Surgical History  Procedure Laterality Date  . Hand surgery     No family history on file. History  Substance Use Topics  . Smoking status: Never Smoker   . Smokeless tobacco: Not on file  . Alcohol Use: Yes     Comment: occassion   OB History    No data available     Review of Systems  10 Systems reviewed and are negative for acute change except as noted in the HPI.    Allergies  Amoxicillin and Shrimp  Home Medications   Prior to Admission medications   Medication Sig Start Date End Date Taking? Authorizing Provider  albuterol (PROVENTIL HFA;VENTOLIN HFA) 108 (90  BASE) MCG/ACT inhaler Inhale 2 puffs into the lungs every 4 (four) hours as needed for wheezing or shortness of breath. 06/30/14  Yes Nicole Pisciotta, PA-C  albuterol (PROVENTIL) (2.5 MG/3ML) 0.083% nebulizer solution Take 2.5 mg by nebulization every 4 (four) hours as needed for wheezing or shortness of breath.   Yes Historical Provider, MD  budesonide-formoterol (SYMBICORT) 160-4.5 MCG/ACT inhaler Inhale 2 puffs into the lungs 2 (two) times daily. 06/30/14  Yes Nicole Pisciotta, PA-C  fluticasone (FLONASE) 50 MCG/ACT nasal spray Place 1 spray into both nostrils daily as needed for allergies. 08/21/13  Yes April Palumbo, MD  ibuprofen (ADVIL,MOTRIN) 200 MG tablet Take 800 mg by mouth every 6 (six) hours as needed for mild pain or moderate pain.   Yes Historical Provider, MD   BP 108/50 mmHg  Pulse 60  Temp(Src) 98.7 F (37.1 C) (Oral)  Resp 15  Ht 5' 3.75" (1.619 m)  Wt 302 lb (136.986 kg)  BMI 52.26 kg/m2  SpO2 100%  LMP 12/12/2014 Physical Exam  Constitutional: She appears well-developed and well-nourished. No distress.  HENT:  Head: Normocephalic and atraumatic.  Eyes: Pupils are equal, round, and reactive to light.  Neck: Normal range of motion. Neck supple.  Cardiovascular: Normal rate and regular rhythm.   Pulmonary/Chest: Effort normal and breath sounds normal. She has no wheezes. She has no rhonchi. She exhibits tenderness.    Abdominal: Soft.  Musculoskeletal:  Right hip: She exhibits tenderness. She exhibits no swelling and no crepitus.  Neurological: She is alert.  Skin: Skin is warm and dry.  Nursing note and vitals reviewed.   ED Course  Procedures (including critical care time) Labs Review Labs Reviewed  COMPREHENSIVE METABOLIC PANEL - Abnormal; Notable for the following:    Albumin 3.2 (*)    Alkaline Phosphatase 36 (*)    GFR calc non Af Amer 85 (*)    All other components within normal limits  D-DIMER, QUANTITATIVE - Abnormal; Notable for the  following:    D-Dimer, Quant 0.56 (*)    All other components within normal limits  CBC WITH DIFFERENTIAL/PLATELET  LIPASE, BLOOD  I-STAT TROPOININ, ED  Angela Reynolds, ED    Imaging Review Dg Chest 2 View  12/18/2014   CLINICAL DATA:  Chest pain, right hip pain starting yesterday  EXAM: CHEST  2 VIEW  COMPARISON:  06/30/2014  FINDINGS: Cardiomediastinal silhouette is stable. No acute infiltrate or pleural effusion. No pulmonary edema. Mild lower thoracic levoscoliosis.  IMPRESSION: No active disease.  Mild lower thoracic levoscoliosis.   Electronically Signed   By: Natasha Mead M.D.   On: 12/18/2014 12:50   Dg Hip Unilat With Pelvis 2-3 Views Right  12/18/2014   CLINICAL DATA:  Hip pain  EXAM: RIGHT HIP (WITH PELVIS) 2-3 VIEWS  COMPARISON:  None.  FINDINGS: Three views of the right hip submitted. No acute fracture or subluxation. Minimal superior spurring bilateral acetabulum.  IMPRESSION: No acute fracture or subluxation. Minimal superior spurring bilateral acetabulum.   Electronically Signed   By: Natasha Mead M.D.   On: 12/18/2014 12:51     EKG Interpretation   Date/Time:  Thursday December 18 2014 10:46:55 EDT Ventricular Rate:  76 PR Interval:  162 QRS Duration: 99 QT Interval:  388 QTC Calculation: 436 R Axis:   76 Text Interpretation:  Sinus rhythm ST elev, probable normal early repol  pattern Baseline wander in lead(s) V6 Confirmed by Lincoln Brigham [phone]) on  12/18/2014 11:45:35 AM      MDM   Final diagnoses:  Chest pain  Hip pain  Chest pressure    Patient possess the emergency department with chest pressure to the left chest that radiates towards back and is worsened with breathing especially large breath and some movements. She denies having URI symptoms. The pain started acutely at 9 AM this morning. She denies having history of the same and denies having history of cardiac problems. She also denies having history of acid reflux or GERD. Due to the quality of the  patient's pain and low risk for PE a d-dimer was ordered which was unfortunately positive therefore a CT image of the chest will be done for further evaluation and to rule out PE.Marland Kitchen  She has had a negative troponin, normal CBC and nonacute findings of CMP or lipase. Her chest x-ray shows no acute findings and her hip showed minimal spurring to bilateral acetabulum.  2:05 pm CT angio with contrast ordered to evaluate and rule out PE.   The CT angio showed no PE. The patient has had two negative Troponins, normal CBC, lipase. Her symptoms are musculoskeletal in nature due to worsening with palpation and movement. No position is better or worse for the pain but movement in general makes it worse. No fevers, fatigue, weakness, lower extremity swelling. Referral to Ortho for hip/msk CP and Cardiology referral to that the patient can have an evaluation to determine her risk for cardiac  dz.  43 y.o.Angela Reynolds's evaluation in the Emergency Department is complete. It has been determined that no acute conditions requiring further emergency intervention are present at this time. The patient/guardian have been advised of the diagnosis and plan. We have discussed signs and symptoms that warrant return to the ED, such as changes or worsening in symptoms.  Vital signs are stable at discharge. Filed Vitals:   12/18/14 1700  BP: 118/44  Pulse: 61  Temp:   Resp:     Patient/guardian has voiced understanding and agreed to follow-up with the PCP or specialist.   Marlon Pel, PA-C 12/19/14 1610  Tilden Fossa, MD 12/19/14 [phone]

## 2014-12-18 NOTE — Discharge Instructions (Signed)
Chest Wall Pain Chest wall pain is pain in or around the bones and muscles of your chest. It may take up to 6 weeks to get better. It may take longer if you must stay physically active in your work and activities.  CAUSES  Chest wall pain may happen on its own. However, it may be caused by:  A viral illness like the flu.  Injury.  Coughing.  Exercise.  Arthritis.  Fibromyalgia.  Shingles. HOME CARE INSTRUCTIONS   Avoid overtiring physical activity. Try not to strain or perform activities that cause pain. This includes any activities using your chest or your abdominal and side muscles, especially if heavy weights are used.  Put ice on the sore area.  Put ice in a plastic bag.  Place a towel between your skin and the bag.  Leave the ice on for 15-20 minutes per hour while awake for the first 2 days.  Only take over-the-counter or prescription medicines for pain, discomfort, or fever as directed by your caregiver. SEEK IMMEDIATE MEDICAL CARE IF:   Your pain increases, or you are very uncomfortable.  You have a fever.  Your chest pain becomes worse.  You have new, unexplained symptoms.  You have nausea or vomiting.  You feel sweaty or lightheaded.  You have a cough with phlegm (sputum), or you cough up blood. MAKE SURE YOU:   Understand these instructions.  Will watch your condition.  Will get help right away if you are not doing well or get worse. Document Released: 09/12/2005 Document Revised: 12/05/2011 Document Reviewed: 05/09/2011 [facility] Patient Information 2015 [place], [place]. This information is not intended to replace advice given to you by your health care provider. Make sure you discuss any questions you have with your health care provider.  Arthralgia Your caregiver has diagnosed you as suffering from an arthralgia. Arthralgia means there is pain in a joint. This can come from many reasons including:  Bruising the joint which causes soreness  (inflammation) in the joint.  Wear and tear on the joints which occur as we grow older (osteoarthritis).  Overusing the joint.  Various forms of arthritis.  Infections of the joint. Regardless of the cause of pain in your joint, most of these different pains respond to anti-inflammatory drugs and rest. The exception to this is when a joint is infected, and these cases are treated with antibiotics, if it is a bacterial infection. HOME CARE INSTRUCTIONS   Rest the injured area for as long as directed by your caregiver. Then slowly start using the joint as directed by your caregiver and as the pain allows. Crutches as directed may be useful if the ankles, knees or hips are involved. If the knee was splinted or casted, continue use and care as directed. If an stretchy or elastic wrapping bandage has been applied today, it should be removed and re-applied every 3 to 4 hours. It should not be applied tightly, but firmly enough to keep swelling down. Watch toes and feet for swelling, bluish discoloration, coldness, numbness or excessive pain. If any of these problems (symptoms) occur, remove the ace bandage and re-apply more loosely. If these symptoms persist, contact your caregiver or return to this location.  For the first 24 hours, keep the injured extremity elevated on pillows while lying down.  Apply ice for 15-20 minutes to the sore joint every couple hours while awake for the first half day. Then 03-04 times per day for the first 48 hours. Put the ice in a  plastic bag and place a towel between the bag of ice and your skin.  Wear any splinting, casting, elastic bandage applications, or slings as instructed.  Only take over-the-counter or prescription medicines for pain, discomfort, or fever as directed by your caregiver. Do not use aspirin immediately after the injury unless instructed by your physician. Aspirin can cause increased bleeding and bruising of the tissues.  If you were given  crutches, continue to use them as instructed and do not resume weight bearing on the sore joint until instructed. Persistent pain and inability to use the sore joint as directed for more than 2 to 3 days are warning signs indicating that you should see a caregiver for a follow-up visit as soon as possible. Initially, a hairline fracture (break in bone) may not be evident on X-rays. Persistent pain and swelling indicate that further evaluation, non-weight bearing or use of the joint (use of crutches or slings as instructed), or further X-rays are indicated. X-rays may sometimes not show a small fracture until a week or 10 days later. Make a follow-up appointment with your own caregiver or one to whom we have referred you. A radiologist (specialist in reading X-rays) may read your X-rays. Make sure you know how you are to obtain your X-ray results. Do not assume everything is normal if you do not hear from [place]. SEEK MEDICAL CARE IF: Bruising, swelling, or pain increases. SEEK IMMEDIATE MEDICAL CARE IF:   Your fingers or toes are numb or blue.  The pain is not responding to medications and continues to stay the same or get worse.  The pain in your joint becomes severe.  You develop a fever over 102 F (38.9 C).  It becomes impossible to move or use the joint. MAKE SURE YOU:   Understand these instructions.  Will watch your condition.  Will get help right away if you are not doing well or get worse. Document Released: 09/12/2005 Document Revised: 12/05/2011 Document Reviewed: 04/30/2008 [facility] Patient Information 2015 [place], [place]. This information is not intended to replace advice given to you by your health care provider. Make sure you discuss any questions you have with your health care provider.

## 2016-02-13 IMAGING — CT CT ANGIO CHEST
2 of 9 series · 19 of 46 positions shown · IV contrast (omnipaque)
Comparison: None.

CLINICAL DATA: Chest pain for 1 day

EXAM:
CT ANGIOGRAPHY CHEST WITH CONTRAST
TECHNIQUE: Multidetector CT imaging of the chest was performed using the
standard protocol during bolus administration of intravenous
contrast. Multiplanar CT image reconstructions and MIPs were
obtained to evaluate the vascular anatomy.
CONTRAST:  100mL OMNIPAQUE IOHEXOL 350 MG/ML SOLN

[Series 6: thins · axial · 0.98mm/px · z∈[-299,-52]mm · 16 of 273 slices shown]
[im 13/273  lung]
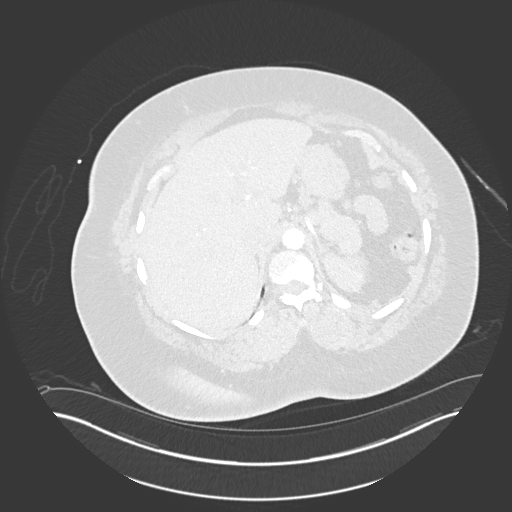
[im 25/273  soft-tissue]
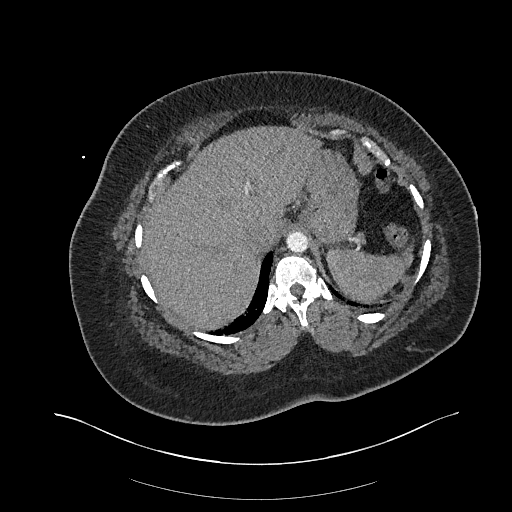
[im 50/273  lung]
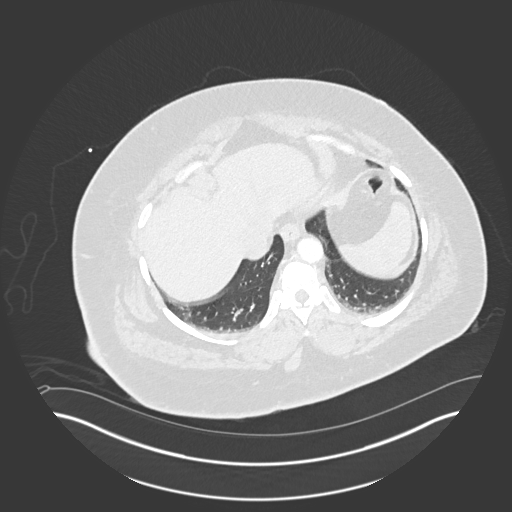
[im 62/273  soft-tissue]
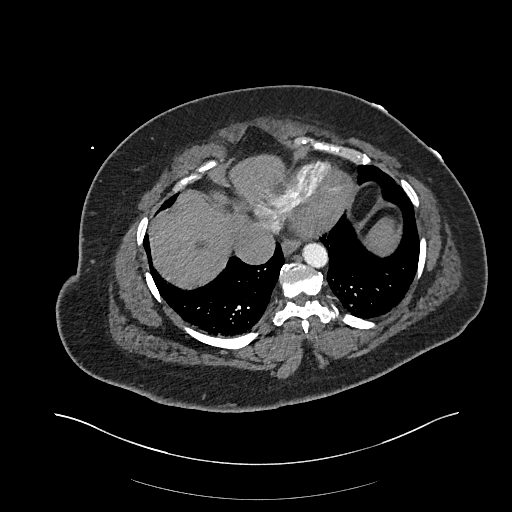
[im 75/273  lung]
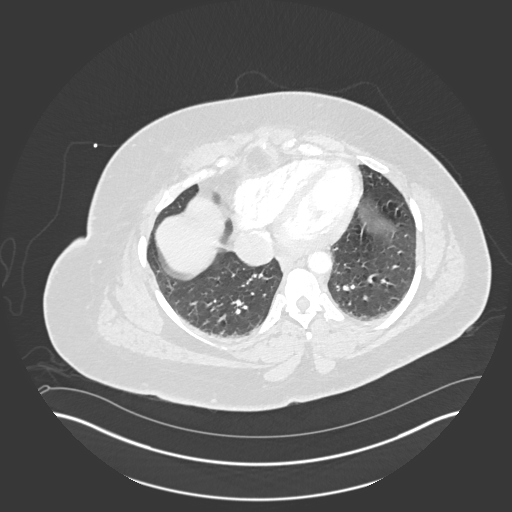
[im 99/273  soft-tissue]
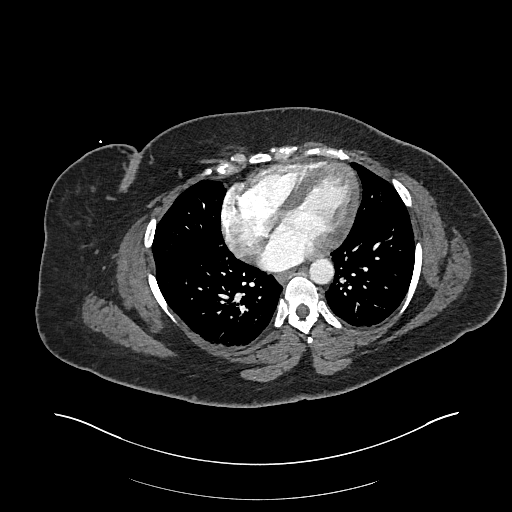
[im 112/273  lung]
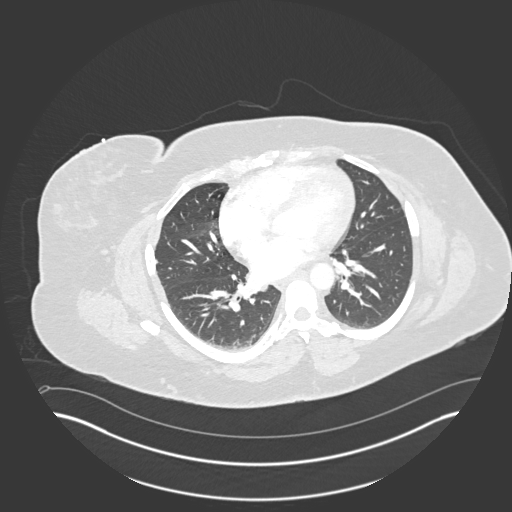
[im 124/273  soft-tissue]
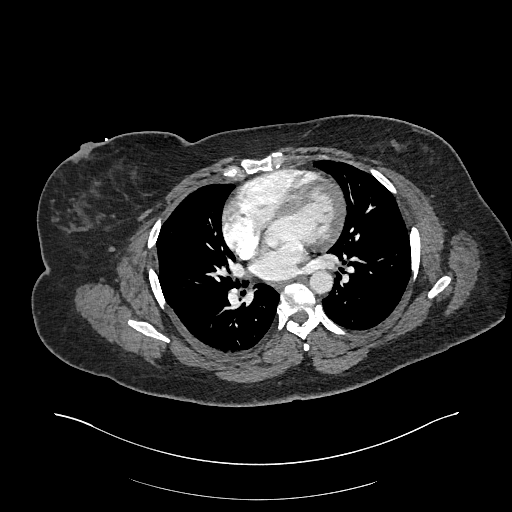
[im 149/273  lung]
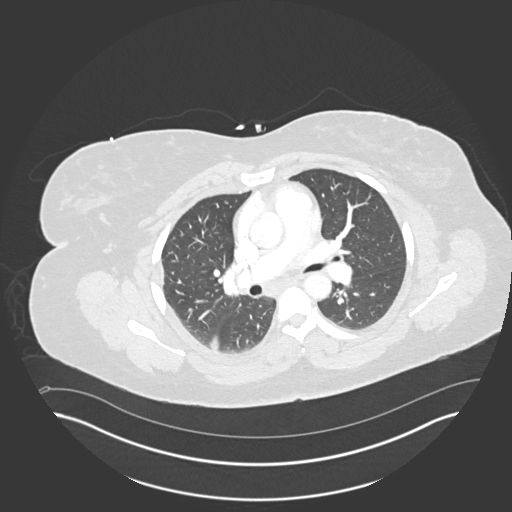
[im 161/273  soft-tissue]
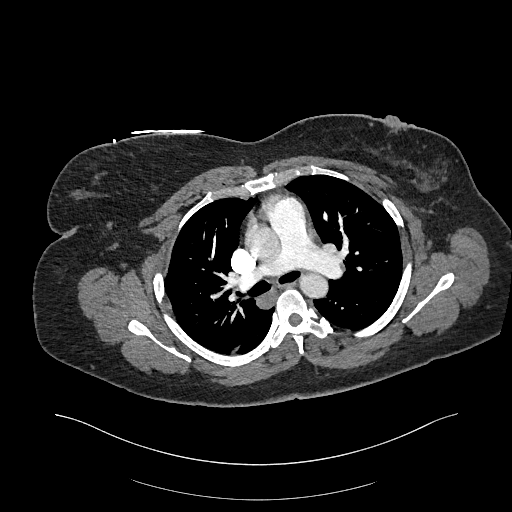
[im 174/273  lung]
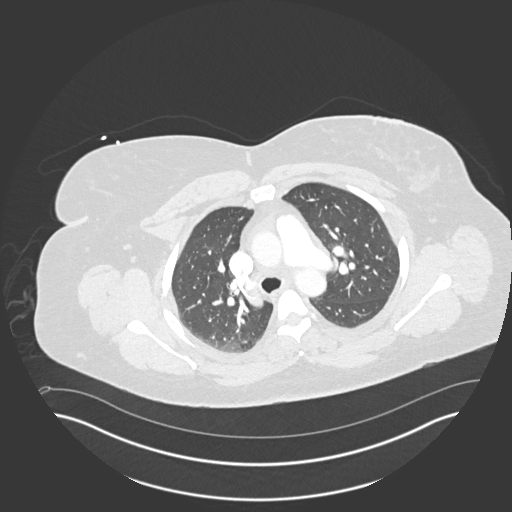
[im 198/273  soft-tissue]
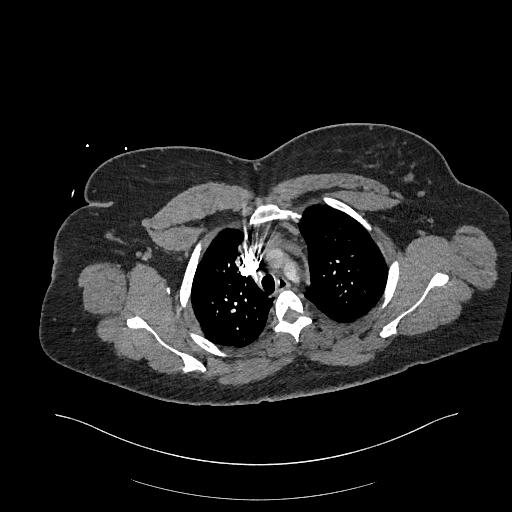
[im 211/273  lung]
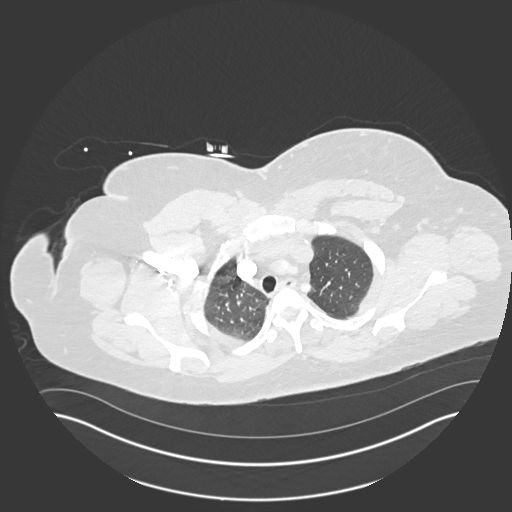
[im 223/273  soft-tissue]
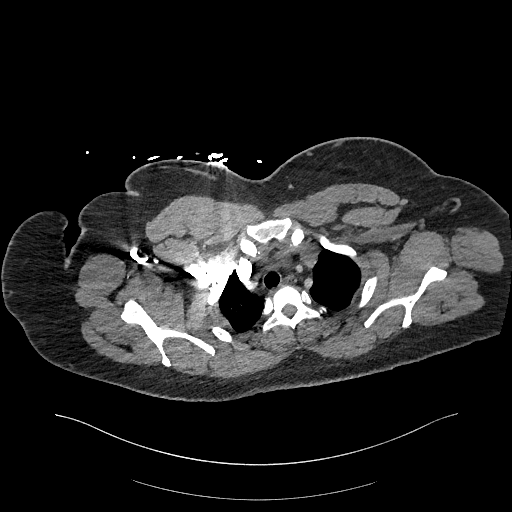
[im 248/273  lung]
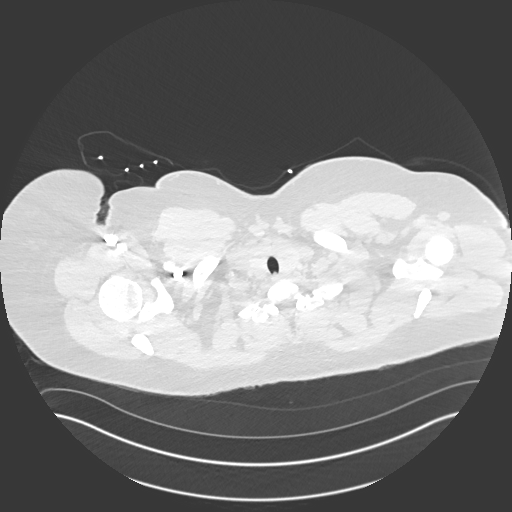
[im 260/273  soft-tissue]
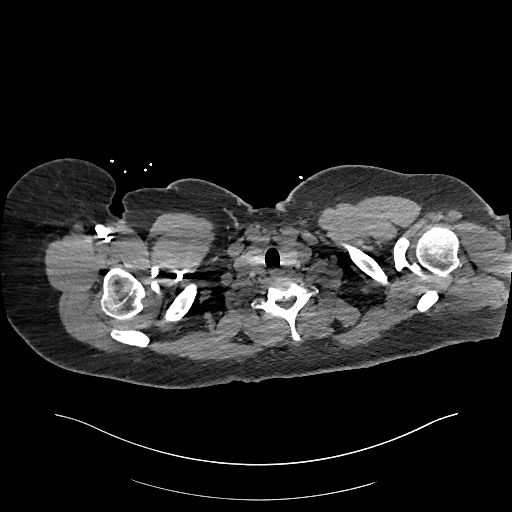

[Series 607: cor mpr · coronal · 0.98mm/px · 3 of 173 slices shown]
[im 50/173  soft-tissue]
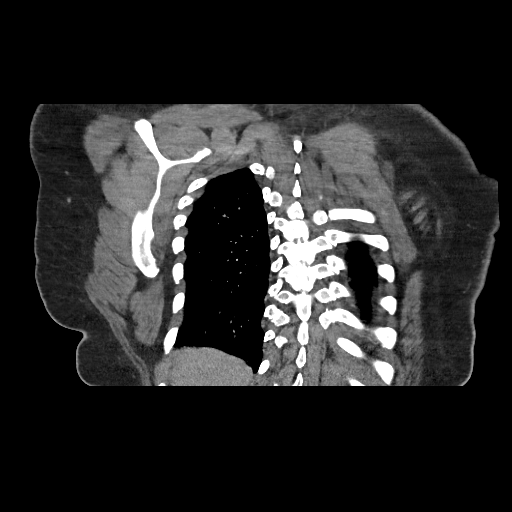
[im 74/173  soft-tissue]
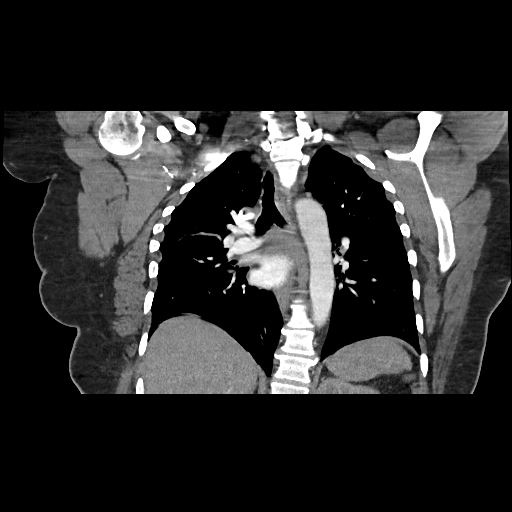
[im 99/173  soft-tissue]
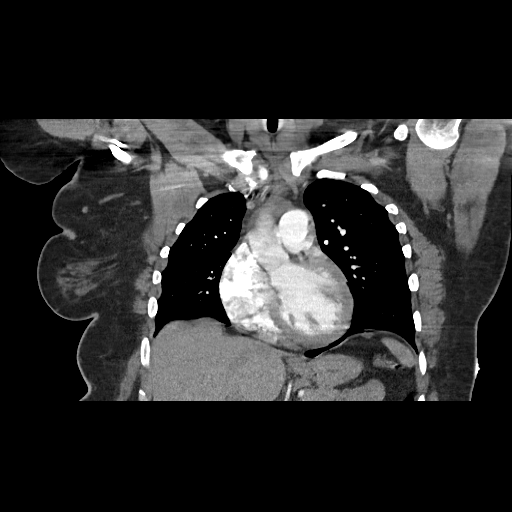

[19 of 46 positions shown; findings below may reference images not displayed]

FINDINGS: The lungs are well aerated bilaterally. No focal infiltrate or
sizable effusion is seen. Pulmonary artery is within normal limits.
No pulmonary emboli are seen. The thoracic aorta is unremarkable. No
significant hilar or mediastinal adenopathy is noted.

Scanning into the upper abdomen shows a small right hepatic cyst. No
acute bony abnormality is noted.

Review of the MIP images confirms the above findings.
IMPRESSION: No evidence of pulmonary emboli.  No focal abnormality is seen.

## 2016-02-13 IMAGING — DX DG CHEST 2V
2 series · 2 of 2 positions shown · non-contrast
Comparison: 06/30/2014

CLINICAL DATA: Chest pain, right hip pain starting yesterday

EXAM:
CHEST  2 VIEW

[chest pa]
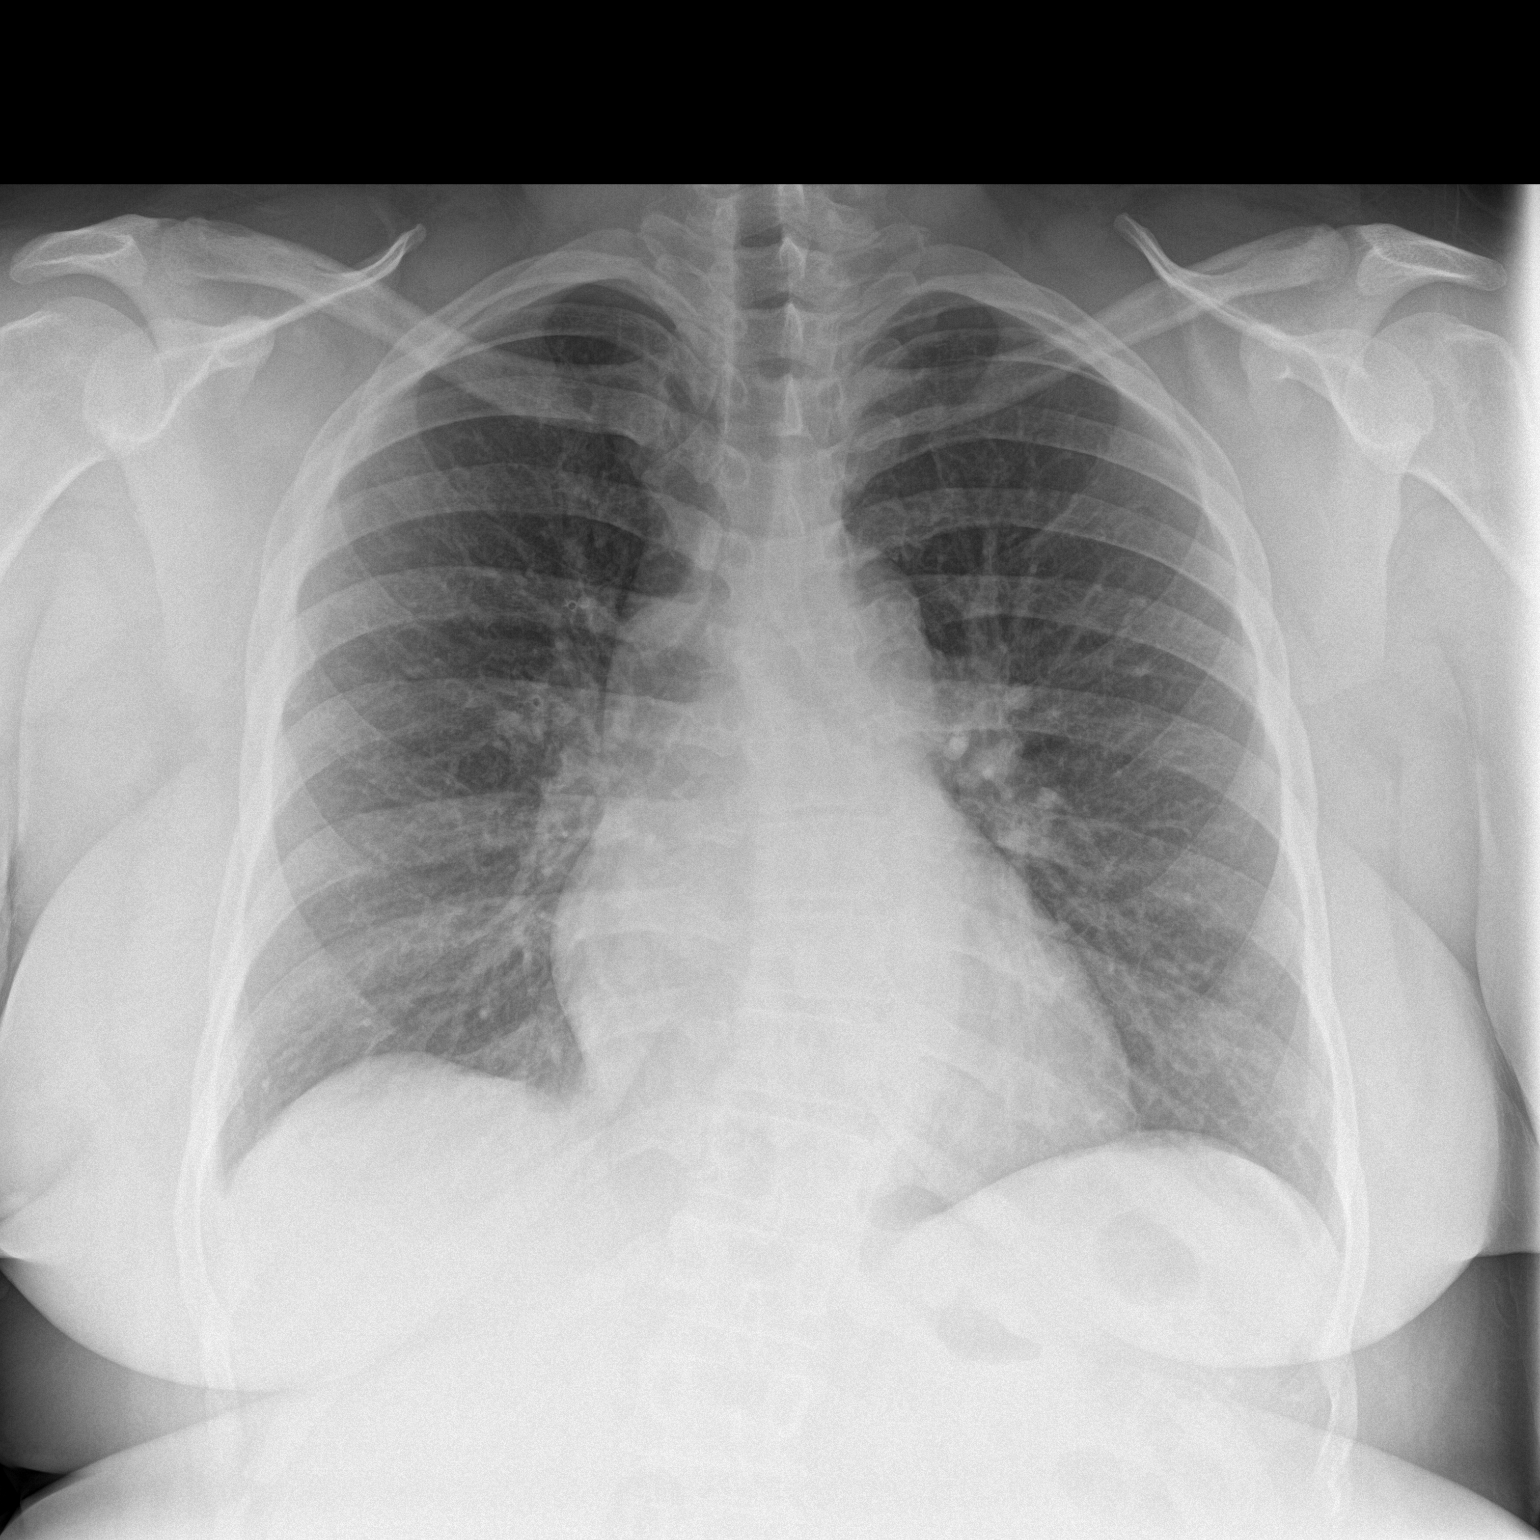

[chest lat]
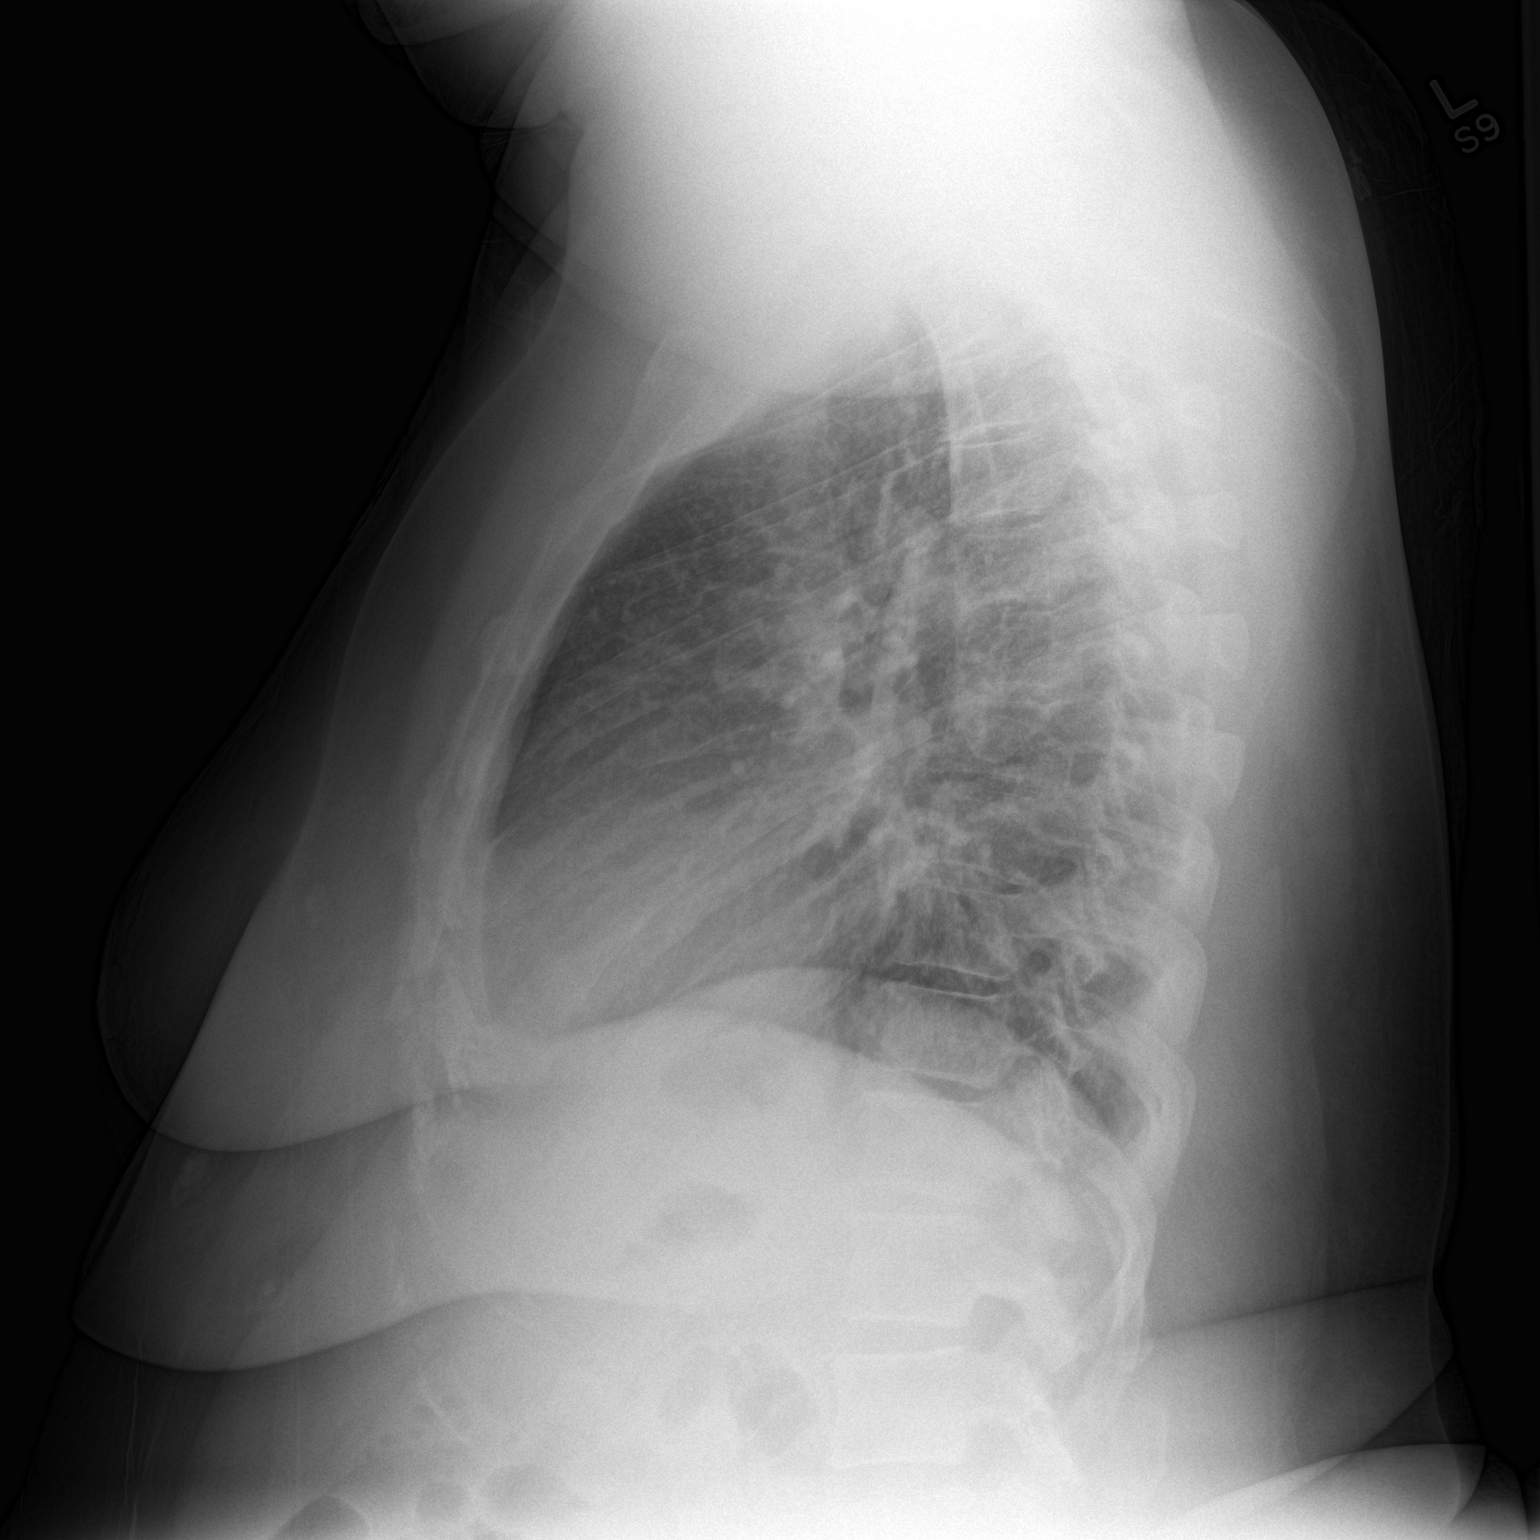

[2 of 2 positions shown; findings below may reference images not displayed]

FINDINGS: Cardiomediastinal silhouette is stable. No acute infiltrate or
pleural effusion. No pulmonary edema. Mild lower thoracic
levoscoliosis.
IMPRESSION: No active disease.  Mild lower thoracic levoscoliosis.

## 2018-05-10 ENCOUNTER — Encounter (HOSPITAL_COMMUNITY): Payer: Self-pay | Admitting: Emergency Medicine

## 2018-05-10 ENCOUNTER — Emergency Department (HOSPITAL_COMMUNITY)
Admission: EM | Admit: 2018-05-10 | Discharge: 2018-05-10 | Disposition: A | Discharge status: Home / Self Care | Payer: Medicaid Other | Attending: Emergency Medicine | Admitting: Emergency Medicine

## 2018-05-10 DIAGNOSIS — Z79899 Other long term (current) drug therapy: Secondary | ICD-10-CM | POA: Diagnosis not present

## 2018-05-10 DIAGNOSIS — J4521 Mild intermittent asthma with (acute) exacerbation: Secondary | ICD-10-CM | POA: Diagnosis not present

## 2018-05-10 DIAGNOSIS — R0602 Shortness of breath: Secondary | ICD-10-CM | POA: Diagnosis present

## 2018-05-10 MED ORDER — ALBUTEROL SULFATE HFA 108 (90 BASE) MCG/ACT IN AERS
2.0000 | INHALATION_SPRAY | Freq: Once | RESPIRATORY_TRACT | Status: AC
  Administered 2018-05-10: 2 via RESPIRATORY_TRACT
  Filled 2018-05-10: qty 6.7

## 2018-05-10 MED ORDER — PREDNISONE 50 MG PO TABS: ORAL_TABLET | ORAL | 0 refills | Status: DC

## 2018-05-10 MED ORDER — PREDNISONE 20 MG PO TABS
60.0000 mg | ORAL_TABLET | Freq: Once | ORAL | Status: AC
  Administered 2018-05-10: 60 mg via ORAL
  Filled 2018-05-10: qty 3

## 2018-05-10 MED ORDER — ALBUTEROL SULFATE (2.5 MG/3ML) 0.083% IN NEBU
5.0000 mg | INHALATION_SOLUTION | Freq: Once | RESPIRATORY_TRACT | Status: AC
  Administered 2018-05-10: 5 mg via RESPIRATORY_TRACT

## 2018-05-10 NOTE — ED Provider Notes (Signed)
[facility]   CSN: [id number] Arrival date & time: 05/10/18  0003     History   Chief Complaint Chief Complaint  Patient presents with  . Shortness of Breath    HPI Angela Reynolds is a 47 y.o. female.  The history is provided by the patient.  Shortness of Breath  This is a new problem. The problem occurs frequently.The current episode started more than 1 week ago. The problem has been gradually worsening. Associated symptoms include cough and wheezing. Pertinent negatives include no fever, no hemoptysis and no chest pain. She has tried nothing for the symptoms.   Patient with history of asthma reports increasing shortness of breath over the past week.  Is been gradually worsening.  No chest pain or fever.  Similar to prior exacerbations. No  Recent hospital admissions. Past Medical History:  Diagnosis Date  . Asthma     There are no active problems to display for this patient.   Past Surgical History:  Procedure Laterality Date  . HAND SURGERY       OB History   None      Home Medications    Prior to Admission medications   Medication Sig Start Date End Date Taking? Authorizing Provider  ibuprofen (ADVIL,MOTRIN) 200 MG tablet Take 800 mg by mouth every 6 (six) hours as needed for mild pain or moderate pain.   Yes [provider]  albuterol (PROVENTIL HFA;VENTOLIN HFA) 108 (90 BASE) MCG/ACT inhaler Inhale 2 puffs into the lungs every 4 (four) hours as needed for wheezing or shortness of breath. Patient not taking: Reported on 05/10/2018 12/18/14   Marlon PelGreene, Tiffany, PA-C  albuterol (PROVENTIL) (2.5 MG/3ML) 0.083% nebulizer solution Take 3 mLs (2.5 mg total) by nebulization every 4 (four) hours as needed for wheezing or shortness of breath. Patient not taking: Reported on 05/10/2018 12/18/14   Marlon PelGreene, Tiffany, PA-C  budesonide-formoterol [facility](SYMBICORT) 160-4.5 MCG/ACT inhaler Inhale 2 puffs into the lungs 2  (two) times daily. Patient not taking: Reported on 05/10/2018 12/18/14   Marlon PelGreene, Tiffany, PA-C    Family History No family history on file.  Social History Social History   Tobacco Use  . Smoking status: Never Smoker  . Smokeless tobacco: Never Used  Substance Use Topics  . Alcohol use: Yes    Comment: occassion  . Drug use: No     Allergies   Amoxicillin and Shrimp [shellfish allergy]   Review of Systems Review of Systems  Constitutional: Negative for fever.  Respiratory: Positive for cough, shortness of breath and wheezing. Negative for hemoptysis.   Cardiovascular: Negative for chest pain.  All other systems reviewed and are negative.    Physical Exam Updated Vital Signs BP 104/64 (BP Location: Left Arm)   Pulse 76   Temp 98.6 F (37 C) (Oral)   Resp 14   Ht 1.626 m (5\' 4" )   Wt (!) 137 kg   SpO2 99%   BMI 51.84 kg/m   Physical Exam  CONSTITUTIONAL: Well developed/well nourished HEAD: Normocephalic/atraumatic EYES: EOMI/PERRL ENMT: Mucous membranes moist NECK: supple no meningeal signs SPINE/BACK:entire spine nontender CV: S1/S2 noted, no murmurs/rubs/gallops noted LUNGS: Scattered wheezing bilaterally, no acute distress ABDOMEN: soft, nontender, no rebound or guarding, bowel sounds noted throughout abdomen GU:no cva tenderness NEURO: Pt is awake/alert/appropriate, moves all extremitiesx4.  No facial droop.   EXTREMITIES: pulses normal/equal, full ROM no edema SKIN: warm, color normal PSYCH: no abnormalities of mood noted, alert and oriented to  situation  ED Treatments / Results  Labs (all labs ordered are listed, but only abnormal results are displayed) Labs Reviewed - No data to display  EKG EKG Interpretation  Date/Time:  Thursday May 10 2018 00:10:11 EDT Ventricular Rate:  78 PR Interval:  158 QRS Duration: 94 QT Interval:  408 QTC Calculation: 465 R Axis:   66 Text Interpretation:  Normal sinus rhythm Cannot rule out Anterior  infarct , age undetermined Abnormal ECG No significant change since last tracing Confirmed by Zadie RhineWickline, Breyson Kelm ([id number]) on 05/10/2018 2:10:06 AM   Radiology No results found.  Procedures Procedures (including critical care time)  Medications Ordered in ED Medications  albuterol (PROVENTIL HFA;VENTOLIN HFA) 108 (90 Base) MCG/ACT inhaler 2 puff (has no administration in time range)  albuterol (PROVENTIL) (2.5 MG/3ML) 0.083% nebulizer solution 5 mg (5 mg Nebulization Given 05/10/18 0116)  predniSONE (DELTASONE) tablet 60 mg (60 mg Oral Given 05/10/18 0302)     Initial Impression / Assessment and Plan / ED Course  I have reviewed the triage vital signs and the nursing notes.       Patient presents for mild asthma exacerbation.  By the time of my evaluation she was improved.  She had minimal wheeze.  She ambulated without difficulty.  Pulse ox 100%.  Will discharge home.  Advised she can use albuterol MDI every 2-4 hours the next 2 days.  We will add on prednisone for 4 days. Teach back utilized at the end of the encounter.  Final Clinical Impressions(s) / ED Diagnoses   Final diagnoses:  Mild intermittent asthma with exacerbation    ED Discharge Orders         Ordered    predniSONE (DELTASONE) 50 MG tablet     05/10/18 0343           Zadie RhineWickline, Clyde Upshaw, MD 05/10/18 [phone]

## 2018-05-10 NOTE — ED Notes (Addendum)
PT ambulated with no assistance down hall. O2 SAT at 100%

## 2018-05-10 NOTE — ED Triage Notes (Signed)
Pt reports SOB "for some time" SOB at rest and with exertion. Denies CP.

## 2018-05-10 NOTE — ED Notes (Signed)
Pt completed w/ breathing treatment at this time, reports improved SOB. LS w/ minimal expiratory wheezing, improved after albuterol treatment

## 2018-07-22 ENCOUNTER — Encounter (HOSPITAL_COMMUNITY): Payer: Self-pay | Admitting: Emergency Medicine

## 2018-07-22 ENCOUNTER — Other Ambulatory Visit: Payer: Self-pay

## 2018-07-22 ENCOUNTER — Emergency Department (HOSPITAL_COMMUNITY)
Admission: EM | Admit: 2018-07-22 | Discharge: 2018-07-22 | Disposition: A | Discharge status: Home / Self Care | Payer: Medicaid Other | Attending: Emergency Medicine | Admitting: Emergency Medicine

## 2018-07-22 ENCOUNTER — Emergency Department (HOSPITAL_COMMUNITY): Payer: Medicaid Other

## 2018-07-22 DIAGNOSIS — J45909 Unspecified asthma, uncomplicated: Secondary | ICD-10-CM | POA: Insufficient documentation

## 2018-07-22 DIAGNOSIS — S6991XA Unspecified injury of right wrist, hand and finger(s), initial encounter: Secondary | ICD-10-CM | POA: Diagnosis present

## 2018-07-22 DIAGNOSIS — W109XXA Fall (on) (from) unspecified stairs and steps, initial encounter: Secondary | ICD-10-CM | POA: Diagnosis not present

## 2018-07-22 DIAGNOSIS — Y929 Unspecified place or not applicable: Secondary | ICD-10-CM | POA: Diagnosis not present

## 2018-07-22 DIAGNOSIS — Y999 Unspecified external cause status: Secondary | ICD-10-CM | POA: Diagnosis not present

## 2018-07-22 DIAGNOSIS — S60221A Contusion of right hand, initial encounter: Secondary | ICD-10-CM | POA: Diagnosis not present

## 2018-07-22 DIAGNOSIS — Y9301 Activity, walking, marching and hiking: Secondary | ICD-10-CM | POA: Insufficient documentation

## 2018-07-22 MED ORDER — MELOXICAM 7.5 MG PO TABS: 7.5000 mg | ORAL_TABLET | Freq: Every day | ORAL | 0 refills | Status: AC

## 2018-07-22 NOTE — ED Notes (Signed)
Pt stable and ambulatory for discharge, states understanding follow up.  

## 2018-07-22 NOTE — Discharge Instructions (Addendum)
Please take Tylenol (acetaminophen) to relieve your pain.  You may take tylenol, up to 1,000 mg (two extra strength pills).  Do not take more than 3,000 mg tylenol in a 24 hour period.  Please check all medication labels as many medications such as pain and cold medications may contain tylenol. Please do not drink alcohol while taking this medication.  ° °I have given you a prescription for Mobic (meloxicam) today.  Mobic is a NSAID medication and you should not take it with other NSAIDs.  Examples of other NSAIDS include motrin, ibuprofen, aleve, naproxen, and Voltaren.  Please monitor your bowel movements for dark, tarry, sticky stools. If you have any bowel movements like this you need to stop taking mobic and call your doctor as this may represent a stomach ulcer from taking NSAIDS.   ° °

## 2018-07-22 NOTE — ED Triage Notes (Signed)
Pt. Stated, I fell on Thursday on some steps and I hurt my rt. Hand , its between my middle and ring finger.

## 2018-07-22 NOTE — ED Notes (Signed)
ED Provider at bedside. 

## 2018-07-22 NOTE — ED Provider Notes (Signed)
[facility] EMERGENCY DEPARTMENT Provider Note   CSN: [id number] Arrival date & time: 07/22/18  1312     History   Chief Complaint Chief Complaint  Patient presents with  . Hand Injury    HPI Angela Reynolds is a 47 y.o. female who presents today for evalaution of right hand injury on Thursday.  She reports that she fell on the stairs, and hurt her right hand.  She reports she also landed on her left knee.  Her hand is her main complaint. She reports that she has tried ice and ibuprofen 800mg  TID with minimal relief.  She reports swelling is improved since then.  Says that her hand only hurts when she hits it on something.  But her left knee is fine, she is ambulatory without difficulty.  She has not been keeping her hand elevated.  HPI  Past Medical History:  Diagnosis Date  . Asthma     There are no active problems to display for this patient.   Past Surgical History:  Procedure Laterality Date  . HAND SURGERY       OB History   None      Home Medications    Prior to Admission medications   Medication Sig Start Date End Date Taking? Authorizing Provider  budesonide-formoterol (SYMBICORT) 160-4.5 MCG/ACT inhaler Inhale 2 puffs into the lungs 2 (two) times daily. Patient not taking: Reported on 05/10/2018 12/18/14   Marlon Pel, PA-C  ibuprofen (ADVIL,MOTRIN) 200 MG tablet Take 800 mg by mouth every 6 (six) hours as needed for mild pain or moderate pain.    [provider]  meloxicam (MOBIC) 7.5 MG tablet Take 1 tablet (7.5 mg total) by mouth daily for 10 days. 07/22/18 08/01/18  Cristina Gong, PA-C  predniSONE (DELTASONE) 50 MG tablet One tablet PO daily for 4 days 05/10/18   Zadie Rhine, MD    Family History No family history on file.  Social History Social History   Tobacco Use  . Smoking status: Never Smoker  . Smokeless tobacco: Never Used  Substance Use Topics  . Alcohol use: Yes    Comment: occassion  .  Drug use: No     Allergies   Amoxicillin and Shrimp [shellfish allergy]   Review of Systems Review of Systems  Constitutional: Negative for chills and fever.  Musculoskeletal: Negative for back pain and neck pain.  Neurological: Negative for weakness and headaches.  All other systems reviewed and are negative.    Physical Exam Updated Vital Signs BP 123/60 (BP Location: Right Arm)   Pulse 66   Temp 97.8 F (36.6 C) (Oral)   Resp 16   Ht 5\' 3"  (1.6 m)   Wt (!) 155.1 kg   LMP 07/13/2018   SpO2 100%   BMI 60.58 kg/m   Physical Exam  Constitutional: She appears well-developed. No distress.  HENT:  Head: Normocephalic.  Cardiovascular: Intact distal pulses.  2+ right radial pulse.  Musculoskeletal:  Mild edema over the right hand, primarily in the webspace between the thumb and index finger.  Tenderness to palpation between the middle and ring finger diffusely.  No crepitus or deformities.  No tenderness to palpation over wrist or over anatomic snuffbox.    No TTP over the left knee.  Ambulatory with out limp. Knee is grossly stable on my exam  Neurological: She is alert.  Sensation intact to right hand.  Skin: Skin is warm and dry. She is not diaphoretic.  No skin  breaks present over the right hand.  No abnormal erythema.   Psychiatric: She has a normal mood and affect. Her behavior is normal.  Nursing note and vitals reviewed.    ED Treatments / Results  Labs (all labs ordered are listed, but only abnormal results are displayed) Labs Reviewed - No data to display  EKG None  Radiology Dg Hand Complete Right  Result Date: 07/22/2018 CLINICAL DATA:  Pt states she fell last Thursday on some steps and injured her right hand. Pt c/o pain between middle and ring finger. EXAM: RIGHT HAND - COMPLETE 3+ VIEW COMPARISON:  None. FINDINGS: No fracture. There is truncation of the distal tuft of the index finger that is likely from a remote injury. Joints are normally  spaced and aligned.  No arthropathic changes. Soft tissues are unremarkable. IMPRESSION: No fracture or dislocation. Electronically Signed   By: Amie Portland M.D.   On: 07/22/2018 14:36    Procedures Procedures (including critical care time)  Medications Ordered in ED Medications - No data to display   Initial Impression / Assessment and Plan / ED Course  I have reviewed the triage vital signs and the nursing notes.  Pertinent labs & imaging results that were available during my care of the patient were reviewed by me and considered in my medical decision making (see chart for details).    Presents today for evaluation of hand pain after she fell Thursday.  X-rays were obtained of the right hand no acute fractures or other abnormalities.  Exam is not consistent with infection, no abnormal erythema or induration.  Mild edema over the hand.  Range of motion of fingers is limited secondary to pain.  Patient states that ibuprofen has not been helping and is requesting something stronger, in the absence of fracture on x-ray, will give prescription for meloxicam.  PCP follow-up as needed.  Recommended prescription elevation.  Per her request was given Ace wrap for comfort.  Return precautions were discussed with patient who states their understanding.  At the time of discharge patient denied any unaddressed complaints or concerns.  Patient is agreeable for discharge home.   Final Clinical Impressions(s) / ED Diagnoses   Final diagnoses:  Contusion of right hand, initial encounter    ED Discharge Orders         Ordered    meloxicam (MOBIC) 7.5 MG tablet  Daily     07/22/18 1602           Cristina Gong, [place] 07/22/18 1607    Bethann Berkshire, MD 07/22/18 [phone]

## 2019-10-15 ENCOUNTER — Emergency Department (HOSPITAL_COMMUNITY): Payer: Medicaid Other

## 2019-10-15 ENCOUNTER — Encounter (HOSPITAL_COMMUNITY): Payer: Self-pay

## 2019-10-15 ENCOUNTER — Emergency Department (HOSPITAL_COMMUNITY)
Admission: EM | Admit: 2019-10-15 | Discharge: 2019-10-15 | Disposition: A | Discharge status: Home / Self Care | Payer: Medicaid Other | Attending: Emergency Medicine | Admitting: Emergency Medicine

## 2019-10-15 DIAGNOSIS — M25512 Pain in left shoulder: Secondary | ICD-10-CM | POA: Diagnosis present

## 2019-10-15 DIAGNOSIS — J45909 Unspecified asthma, uncomplicated: Secondary | ICD-10-CM | POA: Insufficient documentation

## 2019-10-15 DIAGNOSIS — Y999 Unspecified external cause status: Secondary | ICD-10-CM | POA: Insufficient documentation

## 2019-10-15 DIAGNOSIS — Y9389 Activity, other specified: Secondary | ICD-10-CM | POA: Insufficient documentation

## 2019-10-15 DIAGNOSIS — Y92415 Exit ramp or entrance ramp of street or highway as the place of occurrence of the external cause: Secondary | ICD-10-CM | POA: Diagnosis not present

## 2019-10-15 MED ORDER — ALBUTEROL SULFATE HFA 108 (90 BASE) MCG/ACT IN AERS
2.0000 | INHALATION_SPRAY | Freq: Once | RESPIRATORY_TRACT | Status: AC
  Administered 2019-10-15: 2 via RESPIRATORY_TRACT

## 2019-10-15 MED ORDER — LIDOCAINE 5 % EX PTCH: 1.0000 | MEDICATED_PATCH | CUTANEOUS | 0 refills | Status: DC

## 2019-10-15 MED ORDER — ALBUTEROL SULFATE HFA 108 (90 BASE) MCG/ACT IN AERS
INHALATION_SPRAY | RESPIRATORY_TRACT | Status: AC
  Filled 2019-10-15: qty 6.7

## 2019-10-15 NOTE — Discharge Instructions (Addendum)
You were evaluated in the Emergency Department and after careful evaluation, we did not find any emergent condition requiring admission or further testing in the hospital.  Your exam/testing today was overall reassuring.  Your x-ray did not reveal any broken bones.  There is some evidence of arthritis on your x-ray.  Trauma can make arthritis flareup and be very painful.  We recommend follow-up with your orthopedic specialist to consider injection or other pain management options.  Until then you can use the numbing patch provided as directed.  Please return to the Emergency Department if you experience any worsening of your condition.  We encourage you to follow up with a primary care provider.  Thank you for allowing [place] to be a part of your care.
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
## 2020-05-06 ENCOUNTER — Emergency Department (HOSPITAL_COMMUNITY): Payer: Medicaid Other

## 2020-05-06 ENCOUNTER — Other Ambulatory Visit: Payer: Self-pay

## 2020-05-06 ENCOUNTER — Emergency Department (HOSPITAL_COMMUNITY)
Admission: EM | Admit: 2020-05-06 | Discharge: 2020-05-06 | Disposition: A | Discharge status: Home / Self Care | Payer: Medicaid Other | Attending: Emergency Medicine | Admitting: Emergency Medicine

## 2020-05-06 ENCOUNTER — Encounter (HOSPITAL_COMMUNITY): Payer: Self-pay | Admitting: Emergency Medicine

## 2020-05-06 DIAGNOSIS — K429 Umbilical hernia without obstruction or gangrene: Secondary | ICD-10-CM | POA: Diagnosis not present

## 2020-05-06 DIAGNOSIS — J4541 Moderate persistent asthma with (acute) exacerbation: Secondary | ICD-10-CM | POA: Diagnosis not present

## 2020-05-06 DIAGNOSIS — J45901 Unspecified asthma with (acute) exacerbation: Secondary | ICD-10-CM

## 2020-05-06 LAB — CBC
HCT: 39.9 % (ref 36.0–46.0)
Hemoglobin: 12.6 g/dL (ref 12.0–15.0)
MCH: 30.1 pg (ref 26.0–34.0)
MCHC: 31.6 g/dL (ref 30.0–36.0)
MCV: 95.2 fL (ref 80.0–100.0)
Platelets: 262 10*3/uL (ref 150–400)
RBC: 4.19 MIL/uL (ref 3.87–5.11)
RDW: 14.5 % (ref 11.5–15.5)
WBC: 9 10*3/uL (ref 4.0–10.5)
nRBC: 0 % (ref 0.0–0.2)

## 2020-05-06 LAB — COMPREHENSIVE METABOLIC PANEL
ALT: 17 U/L (ref 0–44)
AST: 18 U/L (ref 15–41)
Albumin: 3.8 g/dL (ref 3.5–5.0)
Alkaline Phosphatase: 46 U/L (ref 38–126)
Anion gap: 10 (ref 5–15)
BUN: 10 mg/dL (ref 6–20)
CO2: 27 mmol/L (ref 22–32)
Calcium: 9 mg/dL (ref 8.9–10.3)
Chloride: 101 mmol/L (ref 98–111)
Creatinine, Ser: 0.87 mg/dL (ref 0.44–1.00)
GFR calc Af Amer: >60 mL/min (ref >60)
GFR calc non Af Amer: >60 mL/min (ref >60)
Glucose, Bld: 99 mg/dL (ref 70–99)
Potassium: 4 mmol/L (ref 3.5–5.1)
Sodium: 138 mmol/L (ref 135–145)
Total Bilirubin: 0.6 mg/dL (ref 0.3–1.2)
Total Protein: 7.5 g/dL (ref 6.5–8.1)

## 2020-05-06 LAB — URINALYSIS, ROUTINE W REFLEX MICROSCOPIC
Bilirubin Urine: NEGATIVE
Glucose, UA: NEGATIVE mg/dL
Hgb urine dipstick: NEGATIVE
Ketones, ur: NEGATIVE mg/dL
Leukocytes,Ua: NEGATIVE
Nitrite: NEGATIVE
Protein, ur: NEGATIVE mg/dL
Specific Gravity, Urine: 1.02 (ref 1.005–1.030)
pH: 7 (ref 5.0–8.0)

## 2020-05-06 LAB — I-STAT BETA HCG BLOOD, ED (MC, WL, AP ONLY): I-stat hCG, quantitative: <5 m[IU]/mL (ref <5)

## 2020-05-06 LAB — LIPASE, BLOOD: Lipase: 39 U/L (ref 11–51)

## 2020-05-06 MED ORDER — SODIUM CHLORIDE (PF) 0.9 % IJ SOLN
INTRAMUSCULAR | Status: AC
  Filled 2020-05-06: qty 50

## 2020-05-06 MED ORDER — HYDROCODONE-ACETAMINOPHEN 5-325 MG PO TABS: 1.0000 | ORAL_TABLET | Freq: Four times a day (QID) | ORAL | 0 refills | Status: AC | PRN

## 2020-05-06 MED ORDER — ALBUTEROL SULFATE HFA 108 (90 BASE) MCG/ACT IN AERS
2.0000 | INHALATION_SPRAY | Freq: Four times a day (QID) | RESPIRATORY_TRACT | Status: DC
  Administered 2020-05-06: 2 via RESPIRATORY_TRACT
  Filled 2020-05-06: qty 6.7

## 2020-05-06 MED ORDER — IOHEXOL 300 MG/ML  SOLN
100.0000 mL | Freq: Once | INTRAMUSCULAR | Status: AC | PRN
  Administered 2020-05-06: 100 mL via INTRAVENOUS

## 2020-05-06 NOTE — Discharge Instructions (Addendum)
Use your albuterol inhaler 2 puffs every 6 hours each day.  Make an appointment follow-up with your primary care doctor.  Take the hydrocodone as needed for pain to the umbilical hernia.  Make an appointment to follow-up with general surgery.  Return for any new or worse symptoms.

## 2020-05-06 NOTE — ED Triage Notes (Signed)
Pt having pain with her hernia. Bowels are tight. Only able to keep liquids down, vomiting solids. Having urinary frequency as well. Saw PCP this morning who advised to go to ED for possible blood loss to hernia and go to ED.

## 2020-05-06 NOTE — ED Notes (Signed)
While obtaining v/s in lobby, pt sneezed and began crying and screaming.  Pt tells this writer she needs an "asthma pump" and that she is having "breathing difficulties".  Pt's oxygen sat is 100% on RA. I advised pt that I would notify triage RN. Pt says she has a prescription but hasn't filled it yet and that she needs "7.5 albuterol". Pt then walked to restroom w/out assistance. Triage RN notified.

## 2020-05-06 NOTE — ED Notes (Signed)
Pt requested albuterol asthma inhaler to EMT first while getting her vital signs updated. Pt reports that she has the prescription but hasnt gotten it filled yet.

## 2020-05-06 NOTE — ED Provider Notes (Signed)
[facility]-EMERGENCY DEPT Provider Note   CSN: [id number] Arrival date & time: 05/06/20  1218     History Chief Complaint  Patient presents with  . Hernia    Angela Reynolds is a 49 y.o. female.  Patient with a complaint of nominal pain.  Associated with some nausea.  Also patient has a history of asthma and ran out of her inhalers.  Had trouble with wheezing while in the waiting room today.        Past Medical History:  Diagnosis Date  . Asthma     There are no problems to display for this patient.   Past Surgical History:  Procedure Laterality Date  . HAND SURGERY       OB History   No obstetric history on file.     No family history on file.  Social History   Tobacco Use  . Smoking status: Never Smoker  . Smokeless tobacco: Never Used  Substance Use Topics  . Alcohol use: Yes    Comment: occassion  . Drug use: No    Home Medications Prior to Admission medications   Medication Sig Start Date End Date Taking? Authorizing Provider  albuterol (VENTOLIN HFA) 108 (90 Base) MCG/ACT inhaler Inhale 2 puffs into the lungs every 6 (six) hours as needed for wheezing or shortness of breath.   Yes [provider]  ibuprofen (ADVIL,MOTRIN) 200 MG tablet Take 800 mg by mouth every 6 (six) hours as needed for mild pain or moderate pain.   Yes [provider]  budesonide-formoterol (SYMBICORT) 160-4.5 MCG/ACT inhaler Inhale 2 puffs into the lungs 2 (two) times daily. Patient not taking: Reported on 05/10/2018 12/18/14   Marlon Pel, PA-C  HYDROcodone-acetaminophen (NORCO/VICODIN) 5-325 MG tablet Take 1 tablet by mouth every 6 (six) hours as needed for moderate pain. 05/06/20   Vanetta Mulders, MD  lidocaine (LIDODERM) 5 % Place 1 patch onto the skin daily. Remove & Discard patch within 12 hours or as directed by MD Patient not taking: Reported on 05/06/2020 10/15/19   Sabas Sous, MD  predniSONE (DELTASONE) 50 MG tablet  One tablet PO daily for 4 days Patient not taking: Reported on 05/06/2020 05/10/18   Zadie Rhine, MD    Allergies    Amoxicillin and Shrimp [shellfish allergy]  Review of Systems   Review of Systems  Constitutional: Negative for chills and fever.  HENT: Negative for rhinorrhea and sore throat.   Eyes: Negative for visual disturbance.  Respiratory: Positive for shortness of breath and wheezing. Negative for cough.   Cardiovascular: Negative for chest pain and leg swelling.  Gastrointestinal: Positive for abdominal pain, nausea and vomiting. Negative for diarrhea.  Genitourinary: Negative for dysuria.  Musculoskeletal: Negative for back pain and neck pain.  Skin: Negative for rash.  Neurological: Negative for dizziness, light-headedness and headaches.  Hematological: Does not bruise/bleed easily.  Psychiatric/Behavioral: Negative for confusion.    Physical Exam Updated Vital Signs BP (!) 174/90   Pulse 72   Temp 98.2 F (36.8 C) (Oral)   Resp 16   SpO2 98%   Physical Exam Vitals and nursing note reviewed.  Constitutional:      General: She is not in acute distress.    Appearance: Normal appearance. She is well-developed.  HENT:     Head: Normocephalic and atraumatic.  Eyes:     Extraocular Movements: Extraocular movements intact.     Conjunctiva/sclera: Conjunctivae normal.     Pupils: Pupils are equal, round, and  reactive to light.  Cardiovascular:     Rate and Rhythm: Normal rate and regular rhythm.     Heart sounds: No murmur heard.   Pulmonary:     Effort: Pulmonary effort is normal. No respiratory distress.     Breath sounds: Wheezing present.  Abdominal:     Palpations: Abdomen is soft.     Tenderness: There is no abdominal tenderness.     Hernia: A hernia is present.     Comments: Patient with an umbilical hernia.  That cannot be reduced.  Some tenderness there.  Rest of abdomen without any significant findings.  Musculoskeletal:     Cervical back:  Normal range of motion and neck supple.  Skin:    General: Skin is warm and dry.  Neurological:     General: No focal deficit present.     Mental Status: She is alert and oriented to person, place, and time.     Cranial Nerves: No cranial nerve deficit.     Sensory: No sensory deficit.     Motor: No weakness.     ED Results / Procedures / Treatments   Labs (all labs ordered are listed, but only abnormal results are displayed) Labs Reviewed  LIPASE, BLOOD  COMPREHENSIVE METABOLIC PANEL  CBC  URINALYSIS, ROUTINE W REFLEX MICROSCOPIC  I-STAT BETA HCG BLOOD, ED ([facility], [facility], [facility] ONLY)    EKG None  Radiology CT Abdomen Pelvis W Contrast  Result Date: 05/06/2020 CLINICAL DATA:  Abdominal pain, vomiting EXAM: CT ABDOMEN AND PELVIS WITH CONTRAST TECHNIQUE: Multidetector CT imaging of the abdomen and pelvis was performed using the standard protocol following bolus administration of intravenous contrast. CONTRAST:  OMNIPAQUE IOHEXOL 300 MG/ML  SOLN COMPARISON:  None. FINDINGS: Lower chest: The visualized lung bases are clear. The visualized heart and pericardium are unremarkable. Hepatobiliary: Tiny cysts are seen scattered within the liver. Liver is otherwise unremarkable. Gallbladder unremarkable. No intra or extrahepatic biliary ductal dilation. Pancreas: Unremarkable Spleen: Unremarkable Adrenals/Urinary Tract: Adrenal glands are unremarkable. Kidneys are normal. Bladder is unremarkable. Stomach/Bowel: Stomach is within normal limits. Appendix appears normal. No evidence of bowel wall thickening, distention, or inflammatory changes. Vascular/Lymphatic: No significant vascular findings are present. No enlarged abdominal or pelvic lymph nodes. Reproductive: Intrauterine device in expected position. The pelvic organs are otherwise unremarkable. Other: A a small fat containing umbilical hernia is present with a fascial defect of 17 mm. There is mild infiltration of the herniated mesenteric fat and  small amount of fluid within the hernia sac possibly related to incarceration of the herniated intraluminal fat. Small fat containing left inguinal hernia is present. Musculoskeletal: No acute bone abnormality. IMPRESSION: Small fat containing umbilical hernia with mild infiltration of the adjacent mesentery and small amount of fluid within the hernia sac possibly related to incarceration of the herniated intraluminal fat. Electronically Signed   By: Helyn Numbers MD   On: 05/06/2020 21:28    Procedures Procedures (including critical care time)  Medications Ordered in ED Medications  albuterol (VENTOLIN HFA) 108 (90 Base) MCG/ACT inhaler 2 puff (2 puffs Inhalation Given 05/06/20 2102)  sodium chloride (PF) 0.9 % injection (has no administration in time range)  iohexol (OMNIPAQUE) 300 MG/ML solution 100 mL (100 mLs Intravenous Contrast Given 05/06/20 2104)    ED Course  I have reviewed the triage vital signs and the nursing notes.  Pertinent labs & imaging results that were available during my care of the patient were reviewed by me and considered in my medical  decision making (see chart for details).    MDM Rules/Calculators/A&P                         CT scan shows an umbilical hernia with omental fat that is incarcerated.  No bowel obstruction.  Patient is wheezing resolved here with albuterol inhaler.  She is given that to go home with.  Patient be given prescription of pain medicine follow-up with general surgery regarding hernia repair.  Nothing acute that needs to be done at this point in time.  Labs without any significant abnormalities.  Patient overall feeling better  Final Clinical Impression(s) / ED Diagnoses Final diagnoses:  Umbilical hernia without obstruction and without gangrene  Moderate asthma with acute exacerbation, unspecified whether persistent    Rx / DC Orders ED Discharge Orders         Ordered    HYDROcodone-acetaminophen (NORCO/VICODIN) 5-325 MG tablet   Every 6 hours PRN     Discontinue  Reprint     05/06/20 2325           Vanetta Mulders, MD 05/06/20 2332

## 2021-05-17 ENCOUNTER — Ambulatory Visit: Payer: Medicaid Other | Admitting: Obstetrics and Gynecology

## 2021-05-17 ENCOUNTER — Other Ambulatory Visit (HOSPITAL_COMMUNITY)
Admission: RE | Admit: 2021-05-17 | Discharge: 2021-05-17 | Disposition: A | Discharge status: Home / Self Care | Payer: Medicaid Other | Source: Ambulatory Visit | Attending: Obstetrics and Gynecology | Admitting: Obstetrics and Gynecology

## 2021-05-17 ENCOUNTER — Encounter: Payer: Self-pay | Admitting: Obstetrics and Gynecology

## 2021-05-17 ENCOUNTER — Other Ambulatory Visit: Payer: Self-pay

## 2021-05-17 VITALS — BP 119/78 | HR 69 | Ht 62.0 in | Wt 363.0 lb

## 2021-05-17 DIAGNOSIS — Z30432 Encounter for removal of intrauterine contraceptive device: Secondary | ICD-10-CM | POA: Diagnosis not present

## 2021-05-17 DIAGNOSIS — Z01419 Encounter for gynecological examination (general) (routine) without abnormal findings: Secondary | ICD-10-CM

## 2021-05-17 DIAGNOSIS — Z113 Encounter for screening for infections with a predominantly sexual mode of transmission: Secondary | ICD-10-CM | POA: Insufficient documentation

## 2021-05-17 DIAGNOSIS — Z780 Asymptomatic menopausal state: Secondary | ICD-10-CM | POA: Diagnosis not present

## 2021-05-17 MED ORDER — PAROXETINE HCL 10 MG PO TABS: 10.0000 mg | ORAL_TABLET | Freq: Every day | ORAL | 2 refills | Status: AC

## 2021-05-17 NOTE — Progress Notes (Signed)
Pt states that IUD is about 2 years past time for removal - due to Covid.  Pt is unsure about any other BC at this time.  Pt states that she had pap smear with primary just over a year ago - result normal.  Pt is in need of mammogram, last one about 3 years ago.

## 2021-05-17 NOTE — Progress Notes (Signed)
GYNECOLOGY ANNUAL PREVENTATIVE CARE ENCOUNTER NOTE  History:     Angela Reynolds is a 50 y.o. G52P5000 female here for a routine annual gynecologic exam.  Current complaints: IUD needs to be removed. Mirena placed in 2015. She is postmenopausal she suspects. She had periods on mirena until about 1 year ago and has had one year no bleeding. She has hot flashes, mood swings and brain fog.     Denies abnormal vaginal bleeding, discharge, pelvic pain, problems with intercourse or other gynecologic concerns.      Gynecologic History No LMP recorded. Contraception: IUD Last Pap: unsure.  Last Mammogram: 3 years ago.  Result was normal  Obstetric History OB History  Gravida Para Term Preterm AB Living  5 5 5         SAB IAB Ectopic Multiple Live Births               # Outcome Date GA Lbr Len/2nd Weight Sex Delivery Anes PTL Lv  5 Term           4 Term           3 Term           2 Term           1 Term             Past Medical History:  Diagnosis Date   Asthma     Past Surgical History:  Procedure Laterality Date   HAND SURGERY    - c-sections  Current Outpatient Medications on File Prior to Visit  Medication Sig Dispense Refill   albuterol (VENTOLIN HFA) 108 (90 Base) MCG/ACT inhaler Inhale 2 puffs into the lungs every 6 (six) hours as needed for wheezing or shortness of breath. (Patient not taking: Reported on 05/17/2021)     budesonide-formoterol (SYMBICORT) 160-4.5 MCG/ACT inhaler Inhale 2 puffs into the lungs 2 (two) times daily. (Patient not taking: No sig reported) 1 Inhaler 0   HYDROcodone-acetaminophen (NORCO/VICODIN) 5-325 MG tablet Take 1 tablet by mouth every 6 (six) hours as needed for moderate pain. (Patient not taking: Reported on 05/17/2021) 14 tablet 0   ibuprofen (ADVIL,MOTRIN) 200 MG tablet Take 800 mg by mouth every 6 (six) hours as needed for mild pain or moderate pain. (Patient not taking: Reported on 05/17/2021)     No current facility-administered  medications on file prior to visit.    Allergies  Allergen Reactions   Amoxicillin Anaphylaxis    Has patient had a PCN reaction causing immediate rash, facial/tongue/throat swelling, SOB or lightheadedness with hypotension: Yes Has patient had a PCN reaction causing severe rash involving mucus membranes or skin necrosis: No Has patient had a PCN reaction that required hospitalization: Yes Has patient had a PCN reaction occurring within the last 10 years: No If all of the above answers are "NO", then may proceed with Cephalosporin use.    Shrimp [Shellfish Allergy] Anaphylaxis    Social History:  reports that she has never smoked. She has never used smokeless tobacco. She reports current alcohol use. She reports current drug use. Drug: Marijuana.  History reviewed. No pertinent family history.  The following portions of the patient's history were reviewed and updated as appropriate: allergies, current medications, past family history, past medical history, past social history, past surgical history and problem list.  Review of Systems Pertinent items noted in HPI and remainder of comprehensive ROS otherwise negative.  Physical Exam:  BP 119/78   Pulse 69  Ht 5\' 2"  (1.575 m)   Wt (!) 363 lb (164.7 kg)   BMI 66.39 kg/m  CONSTITUTIONAL: Well-developed, well-nourished female in no acute distress.  HENT:  Normocephalic, atraumatic, External right and left ear normal.  EYES: Conjunctivae and EOM are normal. Pupils are equal, round, and reactive to light. No scleral icterus.  NECK: Normal range of motion, supple, no masses.  Normal thyroid.  SKIN: Skin is warm and dry. No rash noted. Not diaphoretic. No erythema. No pallor. MUSCULOSKELETAL: Normal range of motion. No tenderness.  No cyanosis, clubbing, or edema. NEUROLOGIC: Alert and oriented to person, place, and time. Normal reflexes, muscle tone coordination.  PSYCHIATRIC: Normal mood and affect. Normal behavior. Normal judgment  and thought content.  CARDIOVASCULAR: Normal heart rate noted, regular rhythm RESPIRATORY: Clear to auscultation bilaterally. Effort and breath sounds normal, no problems with respiration noted.  BREASTS: Symmetric in size. No masses, tenderness, skin changes, nipple drainage, or lymphadenopathy bilaterally. Performed in the presence of a chaperone. ABDOMEN: Soft, no distention noted.  No tenderness, rebound or guarding.  PELVIC: External genitalia normal, Vagina normal without discharge, Urethra without abnormality or discharge, no bladder tenderness, cervix normal in appearance, no CMT, uterus normal size, shape, and consistency, no adnexal masses or tenderness, exam obscured by obesity, IUD strings visualized. Performed in the presence of a chaperone.  IUD procedure note  Patient identified, informed consent performed, consent signed.  Patient was in the dorsal lithotomy position, normal external genitalia was noted.  A speculum was placed in the patient's vagina, normal discharge was noted, no lesions. The cervix was visualized, no lesions, no abnormal discharge.  The strings of the IUD were grasped and pulled using ring forceps. The IUD was removed in its entirety.   Patient tolerated the procedure well.    Patient  will monitor bleeding and call if any other bleeding after 2 weeks.        Assessment and Plan:    1. Encounter for annual routine gynecological examination - Cytology - PAP - Cervicovaginal ancillary only - MM 3D SCREEN BREAST BILATERAL; Future  2. Menopause - We discussed the treatment options for menopause - discussing both HRT and non-HRT.  - Discussed the benefits of each and effectiveness - Discussed goals of therapy i.e. reduction of hot flashes (not complete resolution) - Discussed if HRT we do shortest amount of time at lowest dose. We discussed annual attempts at coming of the HRT typically in the fall months when the weather has cooled down. - Discussed  risks of HRT: E+P = breast cancer, clotting, MI/Stroke - Discussed genitourinary symptoms i.e. urinary issues, vaginal dryness and dyspareunia and that for these symptoms, local treatment is best. - She would like: non-HRT options of paroxetine - PARoxetine (PAXIL) 10 MG tablet; Take 1 tablet (10 mg total) by mouth daily.  Dispense: 90 tablet; Refill: 2  3. Encounter for IUD removal - IUD removed successfully  4. Routine screening for STI (sexually transmitted infection) - Cervicovaginal ancillary only     Routine preventative health maintenance measures emphasized. Please refer to After Visit Summary for other counseling recommendations.      , MD, FACOG Obstetrician & Gynecologist, [facility], [facility]

## 2021-05-18 LAB — CERVICOVAGINAL ANCILLARY ONLY
Bacterial Vaginitis (gardnerella): POSITIVE — AB
Candida Glabrata: NEGATIVE
Candida Vaginitis: NEGATIVE
Chlamydia: NEGATIVE
Comment: NEGATIVE
Comment: NEGATIVE
Comment: NEGATIVE
Comment: NEGATIVE
Comment: NEGATIVE
Comment: NORMAL
Neisseria Gonorrhea: NEGATIVE
Trichomonas: POSITIVE — AB

## 2021-05-18 LAB — CYTOLOGY - PAP
Comment: NEGATIVE
Diagnosis: NEGATIVE
High risk HPV: NEGATIVE

## 2021-05-19 ENCOUNTER — Telehealth: Payer: Self-pay

## 2021-05-19 MED ORDER — METRONIDAZOLE 500 MG PO TABS: ORAL_TABLET | ORAL | 0 refills | Status: DC

## 2021-05-19 MED ORDER — METRONIDAZOLE 0.75 % VA GEL: 1.0000 | Freq: Two times a day (BID) | VAGINAL | 0 refills | Status: AC

## 2021-05-19 NOTE — Addendum Note (Signed)
Addended by: Milas Hock A on: 05/19/2021 09:34 AM   Modules accepted: Orders

## 2021-05-19 NOTE — Telephone Encounter (Signed)
TC to pt to make aware of +Trich and +BV no answer  LVM .

## 2021-05-19 NOTE — Telephone Encounter (Signed)
-----   Message from Milas Hock, MD sent at 05/19/2021  9:35 AM EDT ----- Angela Reynolds has Trich and BV. I sent in flagyl PO for the trich and then she can also do the metrogel for the BV. Please let her know. Also her pap and HPV were normal/neg.   Thanks, paula  ----- Message ----- From: Interface, [facility] Sent: 05/18/2021   1:04 PM EDT To: Milas Hock, MD

## 2021-06-16 ENCOUNTER — Ambulatory Visit (HOSPITAL_BASED_OUTPATIENT_CLINIC_OR_DEPARTMENT_OTHER): Payer: Medicaid Other | Attending: Obstetrics and Gynecology | Admitting: Radiology

## 2023-09-23 ENCOUNTER — Encounter (HOSPITAL_COMMUNITY): Payer: Self-pay

## 2023-09-23 ENCOUNTER — Other Ambulatory Visit: Payer: Self-pay

## 2023-09-23 ENCOUNTER — Emergency Department (HOSPITAL_COMMUNITY): Payer: Self-pay

## 2023-09-23 ENCOUNTER — Emergency Department (HOSPITAL_COMMUNITY)
Admission: EM | Admit: 2023-09-23 | Discharge: 2023-09-23 | Disposition: A | Discharge status: Home / Self Care | Payer: Self-pay | Attending: Emergency Medicine | Admitting: Emergency Medicine

## 2023-09-23 DIAGNOSIS — M25551 Pain in right hip: Secondary | ICD-10-CM

## 2023-09-23 DIAGNOSIS — W182XXA Fall in (into) shower or empty bathtub, initial encounter: Secondary | ICD-10-CM | POA: Insufficient documentation

## 2023-09-23 DIAGNOSIS — J4520 Mild intermittent asthma, uncomplicated: Secondary | ICD-10-CM

## 2023-09-23 DIAGNOSIS — W19XXXA Unspecified fall, initial encounter: Secondary | ICD-10-CM

## 2023-09-23 MED ORDER — ALBUTEROL SULFATE HFA 108 (90 BASE) MCG/ACT IN AERS
1.0000 | INHALATION_SPRAY | Freq: Once | RESPIRATORY_TRACT | Status: AC
  Administered 2023-09-23: 1 via RESPIRATORY_TRACT
  Filled 2023-09-23: qty 6.7

## 2023-09-23 MED ORDER — NAPROXEN 500 MG PO TABS: 500.0000 mg | ORAL_TABLET | Freq: Two times a day (BID) | ORAL | 0 refills | Status: AC

## 2023-09-23 MED ORDER — ALBUTEROL SULFATE HFA 108 (90 BASE) MCG/ACT IN AERS: 2.0000 | INHALATION_SPRAY | Freq: Four times a day (QID) | RESPIRATORY_TRACT | 0 refills | Status: AC | PRN

## 2023-09-23 MED ORDER — IPRATROPIUM-ALBUTEROL 0.5-2.5 (3) MG/3ML IN SOLN
3.0000 mL | Freq: Once | RESPIRATORY_TRACT | Status: AC
  Administered 2023-09-23: 3 mL via RESPIRATORY_TRACT
  Filled 2023-09-23: qty 3

## 2023-09-23 MED ORDER — NAPROXEN 250 MG PO TABS
500.0000 mg | ORAL_TABLET | Freq: Once | ORAL | Status: AC
  Administered 2023-09-23: 500 mg via ORAL
  Filled 2023-09-23: qty 2

## 2023-09-23 NOTE — Discharge Instructions (Addendum)
As discussed, follow-up with primary care when you get home for evaluation of your asthma.  Use albuterol inhaler as needed for shortness of breath or wheezing.  Take naproxen twice a day for the next 7 days as needed for pain.  Follow RICE therapy.  Get help right away if: You have any of these problems in your injured area: Numbness. Tingling. Less strength than normal.
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
## 2023-09-23 NOTE — ED Provider Triage Note (Signed)
Emergency Medicine Provider Triage Evaluation Note  Angela Reynolds , a 52 y.o. female  was evaluated in triage.  Pt complains of hip pain.  Review of Systems  Positive:  Negative:   Physical Exam  BP 133/63 (BP Location: Left Arm)   Pulse 72   Temp 98.5 F (36.9 C)   Resp 17   Ht 5\' 2"  (1.575 m)   Wt (!) 158.8 kg   LMP 07/13/2018   SpO2 93%   BMI 64.02 kg/m  Gen:   Awake, no distress   Resp:  Normal effort  MSK:   Moves extremities without difficulty  Other:    Medical Decision Making  Medically screening exam initiated at 5:14 PM.  Appropriate orders placed.  Angela Reynolds was informed that the remainder of the evaluation will be completed by another provider, this initial triage assessment does not replace that evaluation, and the importance of remaining in the ED until their evaluation is complete.  Mechanical fall 12/24. Right hip pain. Denies head trauma, LOC. Denies blood thinners.   Patient also requesting breathing treatment for asthma. Asthma has been getting worse over the past 3 days. Declines coughing. Patient has been out of albuterol x3 weeks.   Angela Reynolds, [place] 09/23/23 [phone]

## 2023-09-23 NOTE — ED Provider Notes (Signed)
[place] EMERGENCY DEPARTMENT AT [facility] Provider Note   CSN: [id number] Arrival date & time: 09/23/23  1558     History  Chief Complaint  Patient presents with   Hip Pain    Angela Reynolds is a 52 y.o. female with a history of asthma who presents the ED today for hip pain.  Patient reports she slipped on the wet floor getting out of the bathtub on 12/24 and landed in a split.  She endorses pain to the anterior and posterior right hip since then.  She has been alternating between ice and heat with some relief of symptoms.  She has not tried any OTC analgesic medication.  Denies any leg pain or swelling.  Additionally, patient requested a breathing treatments in triage for her asthma.  She feels as if her asthma is flaring and does not have an inhaler at home. Denies fevers, wheezing, or cough.  No additional complaints or concerns at this time.    Home Medications Prior to Admission medications   Medication Sig Start Date End Date Taking? Authorizing Provider  naproxen (NAPROSYN) 500 MG tablet Take 1 tablet (500 mg total) by mouth 2 (two) times daily for 7 days. 09/23/23 09/30/23 Yes Maxwell Marion, PA-C  albuterol (VENTOLIN HFA) 108 (90 Base) MCG/ACT inhaler Inhale 2 puffs into the lungs every 6 (six) hours as needed for wheezing or shortness of breath. 09/23/23   [name], PA-C  budesonide-formoterol [facility]) 160-4.5 MCG/ACT inhaler Inhale 2 puffs into the lungs 2 (two) times daily. Patient not taking: No sig reported 12/18/14   Marlon Pel, PA-C  HYDROcodone-acetaminophen (NORCO/VICODIN) 5-325 MG tablet Take 1 tablet by mouth every 6 (six) hours as needed for moderate pain. Patient not taking: Reported on 05/17/2021 05/06/20   Vanetta Mulders, MD  ibuprofen (ADVIL,MOTRIN) 200 MG tablet Take 800 mg by mouth every 6 (six) hours as needed for mild pain or moderate pain. Patient not taking: Reported on 05/17/2021    [provider]   metroNIDAZOLE (FLAGYL) 500 MG tablet Take two tablets by mouth twice a day, for one day.  Or you can take all four tablets at once if you can tolerate it. 05/19/21   Milas Hock, MD  metroNIDAZOLE (METROGEL VAGINAL) 0.75 % vaginal gel Place 1 Applicatorful vaginally 2 (two) times daily. 05/19/21   Milas Hock, MD  PARoxetine (PAXIL) 10 MG tablet Take 1 tablet (10 mg total) by mouth daily. 05/17/21   Milas Hock, MD      Allergies    Amoxicillin and Shrimp [shellfish allergy]    Review of Systems   Review of Systems  Musculoskeletal:        Right hip pain  All other systems reviewed and are negative.   Physical Exam Updated Vital Signs BP 135/86   Pulse 79   Temp 98.5 F (36.9 C)   Resp 16   Ht 5\' 2"  (1.575 m)   Wt (!) 158.8 kg   LMP 07/13/2018   SpO2 98%   BMI 64.02 kg/m  Physical Exam Vitals and nursing note reviewed.  Constitutional:      General: She is not in acute distress.    Appearance: Normal appearance.  HENT:     Head: Normocephalic and atraumatic.     Mouth/Throat:     Mouth: Mucous membranes are moist.  Eyes:     Conjunctiva/sclera: Conjunctivae normal.     Pupils: Pupils are equal, round, and reactive to light.  Cardiovascular:  Rate and Rhythm: Normal rate and regular rhythm.     Pulses: Normal pulses.     Heart sounds: Normal heart sounds.  Pulmonary:     Effort: Pulmonary effort is normal.     Breath sounds: Normal breath sounds.     Comments: I listen to patient's lungs after she already had the initial DuoNeb treatment in triage.  Her lungs are clear to auscultation at my time of evaluation.  Patient is able to speak in full sentences. Oxygen saturation is 98% on room air. Abdominal:     Palpations: Abdomen is soft.     Tenderness: There is no abdominal tenderness.  Musculoskeletal:        General: Tenderness present. Normal range of motion.     Right lower leg: No edema.     Left lower leg: No edema.     Comments: Tenderness to  palpation of the right anterior and posterior hip.  No tenderness of the right thigh, calf, or foot.  ROM of lower extremities intact bilaterally.  Skin:    General: Skin is warm and dry.     Findings: No rash.  Neurological:     General: No focal deficit present.     Mental Status: She is alert.     Sensory: No sensory deficit.     Motor: No weakness.  Psychiatric:        Mood and Affect: Mood normal.        Behavior: Behavior normal.    ED Results / Procedures / Treatments   Labs (all labs ordered are listed, but only abnormal results are displayed) Labs Reviewed - No data to display  EKG None  Radiology DG Hip Unilat W or Wo Pelvis 2-3 Views Right Result Date: 09/23/2023 CLINICAL DATA:  fall EXAM: DG HIP (WITH OR WITHOUT PELVIS) 2-3V RIGHT COMPARISON:  X-ray pelvis 12/18/2014 FINDINGS: There is no evidence of hip fracture or dislocation of the right hip. Frontal view left hip unremarkable with no acute displaced fracture or dislocation. No acute displaced fracture or diastasis of the bones of the pelvis. There is no evidence of arthropathy or other focal bone abnormality. Degenerative changes of the visualized lumbar spine. IMPRESSION: Negative for acute traumatic injury. Electronically Signed   By: Tish Frederickson M.D.   On: 09/23/2023 18:57    Procedures Procedures: not indicated.   Medications Ordered in ED Medications  albuterol (VENTOLIN HFA) 108 (90 Base) MCG/ACT inhaler 1 puff (has no administration in time range)  naproxen (NAPROSYN) tablet 500 mg (has no administration in time range)  ipratropium-albuterol (DUONEB) 0.5-2.5 (3) MG/3ML nebulizer solution 3 mL (3 mLs Nebulization Given 09/23/23 1726)    ED Course/ Medical Decision Making/ A&P                                 Medical Decision Making Risk Prescription drug management.   This patient presents to the ED for concern of right hip pain and asthma, this involves an extensive number of treatment  options, and is a complaint that carries with it a high risk of complications and morbidity.   Differential diagnosis includes: Fracture, dislocation, contusion, muscle strain, abrasion, etc.   Comorbidities  See HPI above   Additional History  Additional history obtained from prior records.   Imaging Studies  I ordered imaging studies including right hip and pelvis x-ray  I independently visualized and interpreted imaging which showed: Negative for  acute traumatic injury. I agree with the radiologist interpretation   Problem List / ED Course / Critical Interventions / Medication Management  Right hip pain I ordered medications including: Naproxen for pain Albuterol inhaler given in the ED to take home for her asthma I have reviewed the patients home medicines and have made adjustments as needed Discussed findings with patient.  All questions were answered.  Advised RICE therapy.   Social Determinants of Health  Physical activity   Test / Admission - Considered  She is hemodynamically stable and safe for discharge home. Advise close primary care follow-up. Return precautions provided.       Final Clinical Impression(s) / ED Diagnoses Final diagnoses:  Right hip pain  Fall, initial encounter  Mild intermittent asthma without complication    Rx / DC Orders ED Discharge Orders          Ordered    albuterol (VENTOLIN HFA) 108 (90 Base) MCG/ACT inhaler  Every 6 hours PRN        09/23/23 1717    naproxen (NAPROSYN) 500 MG tablet  2 times daily        09/23/23 2110              Maxwell Marion, PA-C 09/23/23 2204

## 2023-09-23 NOTE — ED Notes (Addendum)
Assumed care of pt, found her alert and oriented in the bed.  Pt states she fell a week ago in the bathroom "doing a split" and since then the pain has increased.  Pt states the OTC medications have not helped her w/ the pain.    Pt states her asthma has been causing her trouble since she returned to [facility] from [place] a few days ago.  Pt states she has no medications at home.  Feels better since getting treatment.

## 2024-07-06 ENCOUNTER — Emergency Department (HOSPITAL_COMMUNITY): Admission: EM | Admit: 2024-07-06 | Discharge: 2024-07-06 | Disposition: A | Discharge status: Home / Self Care

## 2024-07-06 ENCOUNTER — Emergency Department (HOSPITAL_COMMUNITY): Payer: MEDICAID

## 2024-07-06 ENCOUNTER — Other Ambulatory Visit: Payer: Self-pay

## 2024-07-06 ENCOUNTER — Encounter (HOSPITAL_COMMUNITY): Payer: Self-pay | Admitting: Emergency Medicine

## 2024-07-06 DIAGNOSIS — Z7951 Long term (current) use of inhaled steroids: Secondary | ICD-10-CM | POA: Diagnosis not present

## 2024-07-06 DIAGNOSIS — Z760 Encounter for issue of repeat prescription: Secondary | ICD-10-CM | POA: Insufficient documentation

## 2024-07-06 DIAGNOSIS — M25562 Pain in left knee: Secondary | ICD-10-CM | POA: Insufficient documentation

## 2024-07-06 DIAGNOSIS — I10 Essential (primary) hypertension: Secondary | ICD-10-CM | POA: Diagnosis not present

## 2024-07-06 DIAGNOSIS — R059 Cough, unspecified: Secondary | ICD-10-CM | POA: Insufficient documentation

## 2024-07-06 DIAGNOSIS — J45909 Unspecified asthma, uncomplicated: Secondary | ICD-10-CM | POA: Insufficient documentation

## 2024-07-06 DIAGNOSIS — K0889 Other specified disorders of teeth and supporting structures: Secondary | ICD-10-CM | POA: Insufficient documentation

## 2024-07-06 MED ORDER — CLINDAMYCIN HCL 300 MG PO CAPS
300.0000 mg | ORAL_CAPSULE | Freq: Once | ORAL | Status: AC
  Administered 2024-07-06: 300 mg via ORAL
  Filled 2024-07-06: qty 1

## 2024-07-06 MED ORDER — MELOXICAM 7.5 MG PO TABS: 7.5000 mg | ORAL_TABLET | Freq: Every day | ORAL | 0 refills | Status: AC

## 2024-07-06 MED ORDER — HYDROCODONE-ACETAMINOPHEN 5-325 MG PO TABS: 1.0000 | ORAL_TABLET | Freq: Four times a day (QID) | ORAL | 0 refills | Status: AC | PRN

## 2024-07-06 MED ORDER — KETOROLAC TROMETHAMINE 15 MG/ML IJ SOLN
15.0000 mg | Freq: Once | INTRAMUSCULAR | Status: DC
  Filled 2024-07-06: qty 1

## 2024-07-06 MED ORDER — CLINDAMYCIN HCL 300 MG PO CAPS: 300.0000 mg | ORAL_CAPSULE | Freq: Three times a day (TID) | ORAL | 0 refills | Status: AC

## 2024-07-06 MED ORDER — IBUPROFEN 200 MG PO TABS
600.0000 mg | ORAL_TABLET | Freq: Once | ORAL | Status: AC
  Administered 2024-07-06: 600 mg via ORAL
  Filled 2024-07-06: qty 3

## 2024-07-06 MED ORDER — ALBUTEROL SULFATE HFA 108 (90 BASE) MCG/ACT IN AERS: 2.0000 | INHALATION_SPRAY | RESPIRATORY_TRACT | 0 refills | Status: AC | PRN

## 2024-07-06 MED ORDER — IPRATROPIUM-ALBUTEROL 0.5-2.5 (3) MG/3ML IN SOLN
3.0000 mL | Freq: Once | RESPIRATORY_TRACT | Status: AC
  Administered 2024-07-06: 3 mL via RESPIRATORY_TRACT
  Filled 2024-07-06: qty 3

## 2024-07-06 NOTE — Discharge Instructions (Addendum)
 Your x-ray was clear.  Please start the antibiotic.  Take the meloxicam  for pain.  If you are still having pain you may take the Norco.  Do not drive or drink alcohol taking this as may make you drowsy.  Return to the emergency department for worsening symptoms.

## 2024-07-06 NOTE — ED Triage Notes (Signed)
 Pt reports dental pain, shob, and left knee pain. Pt states she needs a refill of her inhaler because she ran out. Pt denies injury to the knee. Denies fevers.

## 2024-07-06 NOTE — ED Provider Notes (Signed)
  EMERGENCY DEPARTMENT AT [facility] Provider Note   CSN: [id number] Arrival date & time: 07/06/24  1110     Patient presents with: Dental Pain   Angela Reynolds is a 53 y.o. female.   53 year old female with past medical history of asthma presenting to the emergency department today with dental pain.  Patient states she has been having some pain around her right upper incisors now over the past week or 2.  She states that she is also been having an intermittent cough and wheezing consistent with previous episodes of asthma.  She states that she moved here from [place]  and does not have a primary care doctor.  She ran out of her albuterol .  She denies any chest pain with this.  She denies any difficulty breathing or swallowing.  She states that she has been using some antiseptic mouthwash which does seem to be helping some.  She does report anaphylaxis with Augmentin in the past.  She states that she has been taking Tylenol  as well as 800 mg of ibuprofen  every 6 hours with minimal relief for her pain.  She also ports chronic knee pain but denies any new pain or recent injuries.   Dental Pain      Prior to Admission medications   Medication Sig Start Date End Date Taking? Authorizing Provider  albuterol  (VENTOLIN  HFA) 108 (90 Base) MCG/ACT inhaler Inhale 2 puffs into the lungs every 4 (four) hours as needed for wheezing or shortness of breath. 07/06/24  Yes Ula Prentice SAUNDERS, MD  clindamycin (CLEOCIN) 300 MG capsule Take 1 capsule (300 mg total) by mouth 3 (three) times daily. 07/06/24  Yes Ula Prentice SAUNDERS, MD  HYDROcodone -acetaminophen  (NORCO/VICODIN) 5-325 MG tablet Take 1 tablet by mouth every 6 (six) hours as needed. 07/06/24  Yes Ula Prentice SAUNDERS, MD  meloxicam  (MOBIC ) 7.5 MG tablet Take 1 tablet (7.5 mg total) by mouth daily. 07/06/24  Yes Ula Prentice SAUNDERS, MD  albuterol  (VENTOLIN  HFA) 108 (90 Base) MCG/ACT inhaler Inhale 2 puffs into the lungs every 6 (six) hours as  needed for wheezing or shortness of breath. 09/23/23   Hoy Nidia FALCON, PA-C  budesonide -formoterol  (SYMBICORT ) 160-4.5 MCG/ACT inhaler Inhale 2 puffs into the lungs 2 (two) times daily. Patient not taking: No sig reported 12/18/14   Levora Riggs, PA-C  HYDROcodone -acetaminophen  (NORCO/VICODIN) 5-325 MG tablet Take 1 tablet by mouth every 6 (six) hours as needed for moderate pain. Patient not taking: Reported on 05/17/2021 05/06/20   Zackowski, Scott, MD  ibuprofen  (ADVIL ,MOTRIN ) 200 MG tablet Take 800 mg by mouth every 6 (six) hours as needed for mild pain or moderate pain. Patient not taking: Reported on 05/17/2021    [provider]  metroNIDAZOLE  (METROGEL  VAGINAL) 0.75 % vaginal gel Place 1 Applicatorful vaginally 2 (two) times daily. 05/19/21   Cleatus Moccasin, MD  PARoxetine  (PAXIL ) 10 MG tablet Take 1 tablet (10 mg total) by mouth daily. 05/17/21   Cleatus Moccasin, MD    Allergies: Amoxicillin and Shrimp [shellfish allergy]    Review of Systems  HENT:  Positive for dental problem.   Respiratory:  Positive for shortness of breath.   All other systems reviewed and are negative.   Updated Vital Signs BP (!) 178/69 (BP Location: Left Arm)   Pulse 74   Temp 98.5 F (36.9 C) (Oral)   Resp 20   LMP 07/13/2018   SpO2 99%   Physical Exam Vitals and nursing note reviewed.   Gen: NAD Eyes:  PERRL, EOMI HEENT: Patient does have poor dentition with some gingival erythema noted over the hard palate around the right upper incisors, there is a small less than half centimeter fluctuant lesion noted over the hard palate, no significant posterior oropharyngeal swelling is noted Neck: trachea midline Resp: clear to auscultation bilaterally Card: RRR, no murmurs, rubs, or gallops Abd: nontender, nondistended Extremities: no calf tenderness, no edema Vascular: 2+ radial pulses bilaterally, 2+ DP pulses bilaterally Skin: no rashes Psyc: acting appropriately   (all labs ordered are  listed, but only abnormal results are displayed) Labs Reviewed - No data to display  EKG: None  Radiology: DG Chest Portable 1 View Result Date: 07/06/2024 CLINICAL DATA:  Shortness of breath and cough. EXAM: PORTABLE CHEST 1 VIEW COMPARISON:  December 18, 2014 FINDINGS: The heart size and mediastinal contours are within normal limits. Both lungs are clear. The visualized skeletal structures are unremarkable. IMPRESSION: No active disease. Electronically Signed   By: Suzen Dials M.D.   On: 07/06/2024 12:34     Procedures   Medications Ordered in the ED  ketorolac (TORADOL) 15 MG/ML injection 15 mg (15 mg Intramuscular Patient Refused/Not Given 07/06/24 1222)  clindamycin (CLEOCIN) capsule 300 mg (300 mg Oral Given 07/06/24 1217)  ipratropium-albuterol  (DUONEB) 0.5-2.5 (3) MG/3ML nebulizer solution 3 mL (3 mLs Nebulization Given 07/06/24 1219)                                    Medical Decision Making 53 year old female with past medical history of asthma and hypertension presenting to the emergency department today with concern for dental pain.  The patient's exam is reassuring.  She does have a small fluctuant lesion over her hard palate.  Discussed needle aspirating this but the patient declines.  Given the small size I think that this should respond to antibiotics.  Given her amoxicillin/Augmentin allergy will cover her with clindamycin.  I will obtain an x-ray here to evaluate for pulmonary edema, pulmonary infiltrates, pneumothorax.  Will also give the patient a DuoNeb although she is not really wheezing here on exam.  Based on description of her symptoms and reassuring vital signs suspicion for pulmonary embolism is low at this time.  I will give the patient a refill on her albuterol  and she will be discharged with return precautions.  Will give her Toradol here for pain.  The patient's x-ray is clear.  She is discharged with return precautions.  Amount and/or Complexity of Data  Reviewed Radiology: ordered.  Risk Prescription drug management.        Final diagnoses:  Pain, dental    ED Discharge Orders          Ordered    clindamycin (CLEOCIN) 300 MG capsule  3 times daily        07/06/24 1241    meloxicam  (MOBIC ) 7.5 MG tablet  Daily        07/06/24 1245    HYDROcodone -acetaminophen  (NORCO/VICODIN) 5-325 MG tablet  Every 6 hours PRN        07/06/24 1245    albuterol  (VENTOLIN  HFA) 108 (90 Base) MCG/ACT inhaler  Every 4 hours PRN        07/06/24 1245               Ula Prentice SAUNDERS, MD 07/06/24 1246

## 2024-07-16 ENCOUNTER — Other Ambulatory Visit: Payer: Self-pay | Admitting: Physician Assistant

## 2024-07-16 DIAGNOSIS — Z1231 Encounter for screening mammogram for malignant neoplasm of breast: Secondary | ICD-10-CM

## 2024-08-08 ENCOUNTER — Ambulatory Visit
Admission: RE | Admit: 2024-08-08 | Discharge: 2024-08-08 | Disposition: A | Discharge status: Home / Self Care | Payer: MEDICAID | Source: Ambulatory Visit | Attending: Physician Assistant | Admitting: Physician Assistant

## 2024-08-08 DIAGNOSIS — Z1231 Encounter for screening mammogram for malignant neoplasm of breast: Secondary | ICD-10-CM
